# Patient Record
Sex: Male | Born: 1955 | Hispanic: No | Smoking: Current every day smoker
Health system: Southern US, Community
[De-identification: ages and names within clinical notes are randomized; demographics above are authoritative.]

## PROBLEM LIST (undated history)

## (undated) DIAGNOSIS — I2510 Atherosclerotic heart disease of native coronary artery without angina pectoris: Secondary | ICD-10-CM

## (undated) DIAGNOSIS — Q231 Congenital insufficiency of aortic valve: Secondary | ICD-10-CM

## (undated) DIAGNOSIS — E785 Hyperlipidemia, unspecified: Secondary | ICD-10-CM

## (undated) DIAGNOSIS — I219 Acute myocardial infarction, unspecified: Secondary | ICD-10-CM

## (undated) DIAGNOSIS — Q2381 Bicuspid aortic valve: Secondary | ICD-10-CM

## (undated) DIAGNOSIS — I10 Essential (primary) hypertension: Secondary | ICD-10-CM

## (undated) DIAGNOSIS — E119 Type 2 diabetes mellitus without complications: Secondary | ICD-10-CM

## (undated) HISTORY — DX: Type 2 diabetes mellitus without complications: E11.9

## (undated) HISTORY — DX: Acute myocardial infarction, unspecified: I21.9

## (undated) HISTORY — DX: Atherosclerotic heart disease of native coronary artery without angina pectoris: I25.10

## (undated) HISTORY — DX: Bicuspid aortic valve: Q23.81

## (undated) HISTORY — PX: CARDIAC CATHETERIZATION: SHX172

## (undated) HISTORY — DX: Hyperlipidemia, unspecified: E78.5

## (undated) HISTORY — DX: Essential (primary) hypertension: I10

## (undated) HISTORY — DX: Congenital insufficiency of aortic valve: Q23.1

---

## 2004-07-20 ENCOUNTER — Other Ambulatory Visit: Payer: Self-pay

## 2004-07-20 ENCOUNTER — Emergency Department: Payer: Self-pay | Admitting: Emergency Medicine

## 2004-08-06 ENCOUNTER — Ambulatory Visit: Payer: Self-pay | Admitting: Chiropractic Medicine

## 2004-08-23 ENCOUNTER — Ambulatory Visit: Payer: Self-pay

## 2005-09-08 HISTORY — PX: RENAL ARTERY STENT: SHX2321

## 2005-09-08 HISTORY — PX: CORONARY ANGIOPLASTY: SHX604

## 2005-11-06 ENCOUNTER — Observation Stay: Payer: Self-pay | Admitting: Cardiovascular Disease

## 2005-11-06 ENCOUNTER — Other Ambulatory Visit: Payer: Self-pay

## 2006-04-08 ENCOUNTER — Ambulatory Visit: Payer: Self-pay | Admitting: Gastroenterology

## 2006-05-03 ENCOUNTER — Emergency Department: Payer: Self-pay | Admitting: Emergency Medicine

## 2006-05-13 ENCOUNTER — Emergency Department: Payer: Self-pay

## 2006-09-08 DIAGNOSIS — I219 Acute myocardial infarction, unspecified: Secondary | ICD-10-CM

## 2006-09-08 HISTORY — PX: CORONARY ARTERY BYPASS GRAFT: SHX141

## 2006-09-08 HISTORY — DX: Acute myocardial infarction, unspecified: I21.9

## 2006-12-07 ENCOUNTER — Emergency Department: Payer: Self-pay | Admitting: Unknown Physician Specialty

## 2007-07-20 ENCOUNTER — Emergency Department: Payer: Self-pay | Admitting: Internal Medicine

## 2007-07-20 ENCOUNTER — Other Ambulatory Visit: Payer: Self-pay

## 2008-07-30 ENCOUNTER — Emergency Department: Payer: Self-pay | Admitting: Emergency Medicine

## 2008-12-11 ENCOUNTER — Ambulatory Visit: Payer: Self-pay | Admitting: Ophthalmology

## 2008-12-11 ENCOUNTER — Ambulatory Visit: Payer: Self-pay | Admitting: Cardiology

## 2009-01-02 ENCOUNTER — Ambulatory Visit: Payer: Self-pay | Admitting: Ophthalmology

## 2009-01-30 ENCOUNTER — Emergency Department: Payer: Self-pay | Admitting: Emergency Medicine

## 2011-03-18 ENCOUNTER — Ambulatory Visit: Payer: Self-pay | Admitting: Nephrology

## 2011-04-23 ENCOUNTER — Emergency Department: Payer: Self-pay | Admitting: *Deleted

## 2013-10-02 ENCOUNTER — Ambulatory Visit: Payer: Self-pay | Admitting: Physician Assistant

## 2013-10-24 ENCOUNTER — Ambulatory Visit: Payer: Self-pay | Admitting: Cardiovascular Disease

## 2013-10-25 ENCOUNTER — Ambulatory Visit: Payer: Self-pay | Admitting: Cardiovascular Disease

## 2013-11-02 ENCOUNTER — Ambulatory Visit (INDEPENDENT_AMBULATORY_CARE_PROVIDER_SITE_OTHER): Payer: Medicare PPO | Admitting: Cardiovascular Disease

## 2013-11-02 ENCOUNTER — Encounter: Payer: Self-pay | Admitting: Cardiovascular Disease

## 2013-11-02 VITALS — BP 142/76 | HR 50 | Ht 59.0 in | Wt 142.8 lb

## 2013-11-02 DIAGNOSIS — I25810 Atherosclerosis of coronary artery bypass graft(s) without angina pectoris: Secondary | ICD-10-CM

## 2013-11-02 DIAGNOSIS — E1165 Type 2 diabetes mellitus with hyperglycemia: Secondary | ICD-10-CM | POA: Insufficient documentation

## 2013-11-02 DIAGNOSIS — J40 Bronchitis, not specified as acute or chronic: Secondary | ICD-10-CM | POA: Insufficient documentation

## 2013-11-02 DIAGNOSIS — IMO0001 Reserved for inherently not codable concepts without codable children: Secondary | ICD-10-CM

## 2013-11-02 DIAGNOSIS — Z720 Tobacco use: Secondary | ICD-10-CM | POA: Insufficient documentation

## 2013-11-02 DIAGNOSIS — E118 Type 2 diabetes mellitus with unspecified complications: Secondary | ICD-10-CM | POA: Insufficient documentation

## 2013-11-02 DIAGNOSIS — R0602 Shortness of breath: Secondary | ICD-10-CM

## 2013-11-02 DIAGNOSIS — E785 Hyperlipidemia, unspecified: Secondary | ICD-10-CM | POA: Insufficient documentation

## 2013-11-02 DIAGNOSIS — R079 Chest pain, unspecified: Secondary | ICD-10-CM

## 2013-11-02 DIAGNOSIS — E782 Mixed hyperlipidemia: Secondary | ICD-10-CM | POA: Insufficient documentation

## 2013-11-02 DIAGNOSIS — F17200 Nicotine dependence, unspecified, uncomplicated: Secondary | ICD-10-CM | POA: Insufficient documentation

## 2013-11-02 DIAGNOSIS — IMO0002 Reserved for concepts with insufficient information to code with codable children: Secondary | ICD-10-CM

## 2013-11-02 DIAGNOSIS — I10 Essential (primary) hypertension: Secondary | ICD-10-CM | POA: Insufficient documentation

## 2013-11-02 MED ORDER — AZITHROMYCIN 250 MG PO TABS: ORAL_TABLET | ORAL | Status: DC

## 2013-11-02 MED ORDER — NICOTINE 21 MG/24HR TD PT24: 21.0000 mg | MEDICATED_PATCH | Freq: Every day | TRANSDERMAL | Status: DC

## 2013-11-02 NOTE — Assessment & Plan Note (Signed)
We have encouraged continued exercise, careful diet management in an effort to lose weight. 

## 2013-11-02 NOTE — Assessment & Plan Note (Addendum)
Currently with no symptoms of angina. No further workup at this time. Continue current medication regimen. Prior surgical records have been requested

## 2013-11-02 NOTE — Assessment & Plan Note (Signed)
Recommended he stay on his Lipitor. Goal LDL less than 70 

## 2013-11-02 NOTE — Progress Notes (Signed)
Patient ID: Jason Burke, male    DOB: 12-16-1955, 58 y.o.   MRN: [id number]  HPI Comments: Jason Burke is a 58 year old gentleman with history of CAD, CABG x3 in 2008, by his report with stent placed since that time, also renal artery stent in 2007, hypertension, diabetes, who continues to smoke 1-1/2 packs per day who presents to establish care.  He reports that overall he is doing well, is active with no symptoms of chest pain or shortness of breath. He does have some fatigue . He does have a cough with sputum for the past 2-3 months. His postnasal drip. He had an ear infection and was given amoxicillin in January 2015. His ear is better but his cough with sputum has persisted he denies any significant shortness of breath with exertion. Reports that he would like to quit smoking but did not receive his nicotine patches. Otherwise is tolerating his medications reasonably well. Reports his blood pressure is typically well controlled..  EKG shows normal sinus rhythm with rate 50 beats per minute, no significant ST or T wave changes Metoprolol started early February    BP 142/76  Pulse 50  Ht 4\' 11"  (1.499 m)  Wt 142 lb 12 oz (64.751 kg)  BMI 28.82 kg/m2  Review of Systems  Constitutional: Negative.   HENT: Negative.   Eyes: Negative.   Respiratory: Negative.   Cardiovascular: Negative.   Gastrointestinal: Negative.   Endocrine: Negative.   Musculoskeletal: Negative.   Skin: Negative.   Allergic/Immunologic: Negative.   Neurological: Negative.   Hematological: Negative.   Psychiatric/Behavioral: Negative.   All other systems reviewed and are negative.   Outpatient Encounter Prescriptions as of 11/02/2013  Medication Sig  . aspirin EC 81 MG tablet Take 81 mg by mouth daily.  Marland Kitchen. atorvastatin (LIPITOR) 80 MG tablet Take 80 mg by mouth daily.  Marland Kitchen. azithromycin (ZITHROMAX) 250 MG tablet TAKE ONE A DAY  . FLUoxetine (PROZAC) 10 MG capsule Take 10 mg by mouth daily.  Marland Kitchen. lisinopril  (PRINIVIL,ZESTRIL) 2.5 MG tablet Take 2.5 mg by mouth daily.  . metFORMIN (GLUMETZA) 500 MG (MOD) 24 hr tablet Take 500 mg by mouth 2 (two) times daily with a meal.   . metoprolol tartrate (LOPRESSOR) 25 MG tablet Take 12.5 mg by mouth 2 (two) times daily.  . nicotine (NICODERM CQ - DOSED IN MG/24 HOURS) 21 mg/24hr patch Place 1 patch (21 mg total) onto the skin daily.  . nitroGLYCERIN (NITROSTAT) 0.4 MG SL tablet Place 0.4 mg under the tongue every 5 (five) minutes as needed for chest pain.    Physical Exam  Nursing note and vitals reviewed. Constitutional: He is oriented to person, place, and time. He appears well-developed and well-nourished.  HENT:  Head: Normocephalic.  Nose: Nose normal.  Mouth/Throat: Oropharynx is clear and moist.  Eyes: Conjunctivae are normal. Pupils are equal, round, and reactive to light.  Neck: Normal range of motion. Neck supple. No JVD present.  Cardiovascular: Regular rhythm, S1 normal, S2 normal, normal heart sounds and intact distal pulses.  Bradycardia present.  Exam reveals no gallop and no friction rub.   No murmur heard. Pulmonary/Chest: Effort normal and breath sounds normal. No respiratory distress. He has no wheezes. He has no rales. He exhibits no tenderness.  Abdominal: Soft. Bowel sounds are normal. He exhibits no distension. There is no tenderness.  Musculoskeletal: Normal range of motion. He exhibits no edema and no tenderness.  Lymphadenopathy:    He has no cervical adenopathy.  Neurological: He is alert and oriented to person, place, and time. Coordination normal.  Skin: Skin is warm and dry. No rash noted. No erythema.  Psychiatric: He has a normal mood and affect. His behavior is normal. Judgment and thought content normal.      Assessment and Plan

## 2013-11-02 NOTE — Assessment & Plan Note (Signed)
Nicotine patches called him at his request. Long discussion about smoking and heart disease

## 2013-11-02 NOTE — Assessment & Plan Note (Signed)
Blood pressure is well controlled on today's visit. We have recommended he decrease his metoprolol down to 12.5 mg daily and his heart rate is very slow

## 2013-11-02 NOTE — Assessment & Plan Note (Signed)
Given his continued symptoms over the past 2 months, we have prescribed a Z-Pak. Encouraged him to followup with primary care

## 2013-11-02 NOTE — Patient Instructions (Addendum)
You are doing well. PLEASE TAKE Z PAK FOR POSSIBLE BRONCHITIS Take two the first day, then one a day for the rest of the 4 days   Nicotine patches called in   We will obtain old lab work from primary care Please cut the metoprolol down to 1/2 pill once a day  Please call us if you have new issues that need to be addressed before your next appt.  Your physician wants you to follow-up in: 6 months.  You will receive a reminder letter in the mail two months in advance. If you don't receive a letter, please call our office to schedule the follow-up appointment.

## 2014-03-08 NOTE — Telephone Encounter (Signed)
This encounter was created in error - please disregard.

## 2014-08-29 ENCOUNTER — Emergency Department: Payer: Self-pay | Admitting: Emergency Medicine

## 2014-11-28 DIAGNOSIS — S161XXD Strain of muscle, fascia and tendon at neck level, subsequent encounter: Secondary | ICD-10-CM | POA: Insufficient documentation

## 2014-12-11 DIAGNOSIS — M542 Cervicalgia: Secondary | ICD-10-CM | POA: Diagnosis not present

## 2014-12-13 DIAGNOSIS — M542 Cervicalgia: Secondary | ICD-10-CM | POA: Diagnosis not present

## 2014-12-20 DIAGNOSIS — M542 Cervicalgia: Secondary | ICD-10-CM | POA: Diagnosis not present

## 2014-12-22 DIAGNOSIS — M542 Cervicalgia: Secondary | ICD-10-CM | POA: Diagnosis not present

## 2014-12-22 DIAGNOSIS — F17200 Nicotine dependence, unspecified, uncomplicated: Secondary | ICD-10-CM | POA: Diagnosis not present

## 2014-12-22 DIAGNOSIS — I10 Essential (primary) hypertension: Secondary | ICD-10-CM | POA: Diagnosis not present

## 2014-12-22 DIAGNOSIS — E663 Overweight: Secondary | ICD-10-CM | POA: Diagnosis not present

## 2014-12-22 DIAGNOSIS — I252 Old myocardial infarction: Secondary | ICD-10-CM | POA: Diagnosis not present

## 2014-12-22 DIAGNOSIS — K219 Gastro-esophageal reflux disease without esophagitis: Secondary | ICD-10-CM | POA: Diagnosis not present

## 2014-12-22 DIAGNOSIS — J449 Chronic obstructive pulmonary disease, unspecified: Secondary | ICD-10-CM | POA: Diagnosis not present

## 2014-12-22 DIAGNOSIS — E785 Hyperlipidemia, unspecified: Secondary | ICD-10-CM | POA: Diagnosis not present

## 2014-12-22 DIAGNOSIS — E119 Type 2 diabetes mellitus without complications: Secondary | ICD-10-CM | POA: Diagnosis not present

## 2014-12-22 DIAGNOSIS — M545 Low back pain: Secondary | ICD-10-CM | POA: Diagnosis not present

## 2014-12-26 DIAGNOSIS — M542 Cervicalgia: Secondary | ICD-10-CM | POA: Diagnosis not present

## 2014-12-28 DIAGNOSIS — M6281 Muscle weakness (generalized): Secondary | ICD-10-CM | POA: Diagnosis not present

## 2014-12-28 DIAGNOSIS — M542 Cervicalgia: Secondary | ICD-10-CM | POA: Diagnosis not present

## 2015-01-09 DIAGNOSIS — S161XXD Strain of muscle, fascia and tendon at neck level, subsequent encounter: Secondary | ICD-10-CM | POA: Diagnosis not present

## 2015-01-24 DIAGNOSIS — H5710 Ocular pain, unspecified eye: Secondary | ICD-10-CM | POA: Diagnosis not present

## 2015-01-24 DIAGNOSIS — L239 Allergic contact dermatitis, unspecified cause: Secondary | ICD-10-CM | POA: Diagnosis not present

## 2015-01-24 DIAGNOSIS — R7309 Other abnormal glucose: Secondary | ICD-10-CM | POA: Diagnosis not present

## 2015-01-24 DIAGNOSIS — Z1389 Encounter for screening for other disorder: Secondary | ICD-10-CM | POA: Diagnosis not present

## 2015-03-27 DIAGNOSIS — E11319 Type 2 diabetes mellitus with unspecified diabetic retinopathy without macular edema: Secondary | ICD-10-CM | POA: Diagnosis not present

## 2015-09-25 DIAGNOSIS — Z1389 Encounter for screening for other disorder: Secondary | ICD-10-CM | POA: Diagnosis not present

## 2015-09-25 DIAGNOSIS — Z23 Encounter for immunization: Secondary | ICD-10-CM | POA: Diagnosis not present

## 2015-09-25 DIAGNOSIS — Z0000 Encounter for general adult medical examination without abnormal findings: Secondary | ICD-10-CM | POA: Diagnosis not present

## 2015-10-02 DIAGNOSIS — R7309 Other abnormal glucose: Secondary | ICD-10-CM | POA: Diagnosis not present

## 2015-10-02 DIAGNOSIS — I2510 Atherosclerotic heart disease of native coronary artery without angina pectoris: Secondary | ICD-10-CM | POA: Diagnosis not present

## 2015-10-02 DIAGNOSIS — J019 Acute sinusitis, unspecified: Secondary | ICD-10-CM | POA: Diagnosis not present

## 2015-10-02 DIAGNOSIS — F17200 Nicotine dependence, unspecified, uncomplicated: Secondary | ICD-10-CM | POA: Diagnosis not present

## 2015-10-02 DIAGNOSIS — S134XXS Sprain of ligaments of cervical spine, sequela: Secondary | ICD-10-CM | POA: Diagnosis not present

## 2015-10-02 DIAGNOSIS — I10 Essential (primary) hypertension: Secondary | ICD-10-CM | POA: Diagnosis not present

## 2015-10-02 DIAGNOSIS — E119 Type 2 diabetes mellitus without complications: Secondary | ICD-10-CM | POA: Diagnosis not present

## 2017-05-06 DIAGNOSIS — F17210 Nicotine dependence, cigarettes, uncomplicated: Secondary | ICD-10-CM | POA: Diagnosis not present

## 2017-05-06 DIAGNOSIS — I252 Old myocardial infarction: Secondary | ICD-10-CM | POA: Diagnosis not present

## 2017-05-06 DIAGNOSIS — M170 Bilateral primary osteoarthritis of knee: Secondary | ICD-10-CM | POA: Diagnosis not present

## 2017-05-06 DIAGNOSIS — Z951 Presence of aortocoronary bypass graft: Secondary | ICD-10-CM | POA: Diagnosis not present

## 2017-05-06 DIAGNOSIS — M62838 Other muscle spasm: Secondary | ICD-10-CM | POA: Diagnosis not present

## 2017-05-06 DIAGNOSIS — M62830 Muscle spasm of back: Secondary | ICD-10-CM | POA: Diagnosis not present

## 2017-05-06 DIAGNOSIS — M898X5 Other specified disorders of bone, thigh: Secondary | ICD-10-CM | POA: Diagnosis not present

## 2017-05-06 DIAGNOSIS — M25552 Pain in left hip: Secondary | ICD-10-CM | POA: Diagnosis not present

## 2017-05-06 DIAGNOSIS — E119 Type 2 diabetes mellitus without complications: Secondary | ICD-10-CM | POA: Diagnosis not present

## 2017-05-06 DIAGNOSIS — M545 Low back pain: Secondary | ICD-10-CM | POA: Diagnosis not present

## 2017-05-06 DIAGNOSIS — I10 Essential (primary) hypertension: Secondary | ICD-10-CM | POA: Diagnosis not present

## 2017-05-06 DIAGNOSIS — M25551 Pain in right hip: Secondary | ICD-10-CM | POA: Diagnosis not present

## 2017-05-06 DIAGNOSIS — M2578 Osteophyte, vertebrae: Secondary | ICD-10-CM | POA: Diagnosis not present

## 2018-03-16 ENCOUNTER — Other Ambulatory Visit: Payer: Self-pay | Admitting: Family Medicine

## 2018-03-16 DIAGNOSIS — Z13820 Encounter for screening for osteoporosis: Secondary | ICD-10-CM

## 2019-01-17 ENCOUNTER — Encounter (INDEPENDENT_AMBULATORY_CARE_PROVIDER_SITE_OTHER): Payer: Medicare HMO | Admitting: Ophthalmology

## 2019-01-17 ENCOUNTER — Other Ambulatory Visit: Payer: Self-pay

## 2019-01-17 DIAGNOSIS — E11319 Type 2 diabetes mellitus with unspecified diabetic retinopathy without macular edema: Secondary | ICD-10-CM

## 2019-01-17 DIAGNOSIS — I10 Essential (primary) hypertension: Secondary | ICD-10-CM

## 2019-01-17 DIAGNOSIS — E113392 Type 2 diabetes mellitus with moderate nonproliferative diabetic retinopathy without macular edema, left eye: Secondary | ICD-10-CM

## 2019-01-17 DIAGNOSIS — H43813 Vitreous degeneration, bilateral: Secondary | ICD-10-CM

## 2019-01-17 DIAGNOSIS — H35033 Hypertensive retinopathy, bilateral: Secondary | ICD-10-CM | POA: Diagnosis not present

## 2019-01-17 DIAGNOSIS — H34811 Central retinal vein occlusion, right eye, with macular edema: Secondary | ICD-10-CM | POA: Diagnosis not present

## 2019-02-14 ENCOUNTER — Encounter (INDEPENDENT_AMBULATORY_CARE_PROVIDER_SITE_OTHER): Payer: Medicare HMO | Admitting: Ophthalmology

## 2019-04-20 ENCOUNTER — Other Ambulatory Visit: Payer: Self-pay | Admitting: Family Medicine

## 2019-04-20 DIAGNOSIS — Z13820 Encounter for screening for osteoporosis: Secondary | ICD-10-CM

## 2019-04-22 ENCOUNTER — Telehealth: Payer: Self-pay | Admitting: *Deleted

## 2019-04-22 ENCOUNTER — Encounter: Payer: Self-pay | Admitting: *Deleted

## 2019-04-22 DIAGNOSIS — Z122 Encounter for screening for malignant neoplasm of respiratory organs: Secondary | ICD-10-CM

## 2019-04-22 DIAGNOSIS — Z87891 Personal history of nicotine dependence: Secondary | ICD-10-CM

## 2019-04-22 NOTE — Telephone Encounter (Signed)
Received referral for initial lung cancer screening scan. Contacted patient and obtained smoking history,(current, 55 pack year) as well as answering questions related to screening process. Patient denies signs of lung cancer such as weight loss or hemoptysis. Patient denies comorbidity that would prevent curative treatment if lung cancer were found. Patient is scheduled for shared decision making visit and CT scan on 04/29/19 at 145pm.

## 2019-04-29 ENCOUNTER — Other Ambulatory Visit: Payer: Self-pay

## 2019-04-29 ENCOUNTER — Inpatient Hospital Stay: Payer: Medicare HMO | Attending: Oncology | Admitting: Nurse Practitioner

## 2019-04-29 ENCOUNTER — Ambulatory Visit
Admission: RE | Admit: 2019-04-29 | Discharge: 2019-04-29 | Disposition: A | Discharge status: Home / Self Care | Payer: Medicare HMO | Source: Ambulatory Visit | Attending: Oncology | Admitting: Oncology

## 2019-04-29 DIAGNOSIS — Z122 Encounter for screening for malignant neoplasm of respiratory organs: Secondary | ICD-10-CM

## 2019-04-29 DIAGNOSIS — Z87891 Personal history of nicotine dependence: Secondary | ICD-10-CM

## 2019-04-29 NOTE — Progress Notes (Signed)
Virtual Visit via Video Enabled Telemedicine Note   I connected with Jason Burke on 04/29/19 at 1:45 PM EST by video enabled telemedicine visit and verified that I am speaking with the correct person using two identifiers.   I discussed the limitations, risks, security and privacy concerns of performing an evaluation and management service by telemedicine and the availability of in-person appointments. I also discussed with the patient that there may be a patient responsible charge related to this service. The patient expressed understanding and agreed to proceed.   Other persons participating in the visit and their role in the encounter: Burgess Estelle, RN- checking in patient & navigation  Patient's location: clinic/imaging center  Provider's location: clinic  Chief Complaint: Low Dose CT Screening  Patient agreed to evaluation by telemedicine to discuss shared decision making for consideration of low dose CT lung cancer screening.    In accordance with CMS guidelines, patient has met eligibility criteria including age, absence of signs or symptoms of lung cancer.  Social History   Tobacco Use  . Smoking status: Current Every Day Smoker    Packs/day: 1.00    Years: 55.00    Pack years: 55.00    Types: Cigarettes  . Tobacco comment: .5ppd currently  Substance Use Topics  . Alcohol use: No     A shared decision-making session was conducted prior to the performance of CT scan. This includes one or more decision aids, includes benefits and harms of screening, follow-up diagnostic testing, over-diagnosis, false positive rate, and total radiation exposure.   Counseling on the importance of adherence to annual lung cancer LDCT screening, impact of co-morbidities, and ability or willingness to undergo diagnosis and treatment is imperative for compliance of the program.   Counseling on the importance of continued smoking cessation for former smokers; the importance of smoking cessation  for current smokers, and information about tobacco cessation interventions have been given to patient including [place] and [employer] [place] programs.   Written order for lung cancer screening with LDCT has been given to the patient and any and all questions have been answered to the best of my abilities.    Yearly follow up will be coordinated by Burgess Estelle, Thoracic Navigator.  I discussed the assessment and treatment plan with the patient. The patient was provided an opportunity to ask questions and all were answered. The patient agreed with the plan and demonstrated an understanding of the instructions.   The patient was advised to call back or seek an in-person evaluation if the symptoms worsen or if the condition fails to improve as anticipated.   I provided 15 minutes of face-to-face video visit time during this encounter, and > 50% was spent counseling as documented under my assessment & plan.   Beckey Rutter, DNP, AGNP-C [place] at [facility] [phone] (work cell) [phone] (office)

## 2019-05-02 ENCOUNTER — Telehealth: Payer: Self-pay | Admitting: *Deleted

## 2019-05-02 NOTE — Telephone Encounter (Signed)
Notified patient of LDCT lung cancer screening program results with recommendation for 12 month follow up imaging. Also notified of incidental findings noted below and is encouraged to discuss further with PCP who will receive a copy of this note and/or the CT report. Patient verbalizes understanding.   IMPRESSION: 1. Lung-RADS 2, benign appearance or behavior. Continue annual screening with low-dose chest CT without contrast in 12 months. 2. Aortic Atherosclerosis (ICD10-I70.0) and Emphysema

## 2019-09-27 ENCOUNTER — Other Ambulatory Visit: Payer: Medicare HMO

## 2020-04-16 ENCOUNTER — Telehealth: Payer: Self-pay | Admitting: *Deleted

## 2020-04-16 NOTE — Telephone Encounter (Signed)
Writer phoned patient on this date and left voicemail to discuss patient's annual lung cancer screening CT scan. Request made for patient to return call to the Cancer Center.

## 2020-05-04 ENCOUNTER — Telehealth: Payer: Self-pay | Admitting: *Deleted

## 2020-05-04 NOTE — Telephone Encounter (Signed)
I called to do his annual Lung Cancer Screening. I had to leave a VM on 2 different phones. I gave him Shawn's work number to call back.

## 2020-05-19 ENCOUNTER — Telehealth: Payer: Self-pay

## 2020-05-19 NOTE — Telephone Encounter (Signed)
Message left notifying patient that it is time to schedule the low dose lung cancer screening CT scan.  Instructed patient to return call to Shawn Perkins at [phone] to verify information prior to CT scan being scheduled.    

## 2020-12-24 ENCOUNTER — Other Ambulatory Visit: Payer: Self-pay | Admitting: Physician Assistant

## 2020-12-24 DIAGNOSIS — R0989 Other specified symptoms and signs involving the circulatory and respiratory systems: Secondary | ICD-10-CM

## 2020-12-26 ENCOUNTER — Other Ambulatory Visit: Payer: Self-pay

## 2021-01-10 ENCOUNTER — Other Ambulatory Visit (INDEPENDENT_AMBULATORY_CARE_PROVIDER_SITE_OTHER): Payer: Self-pay | Admitting: Vascular Surgery

## 2021-01-10 DIAGNOSIS — R0989 Other specified symptoms and signs involving the circulatory and respiratory systems: Secondary | ICD-10-CM

## 2021-01-11 ENCOUNTER — Ambulatory Visit (INDEPENDENT_AMBULATORY_CARE_PROVIDER_SITE_OTHER): Payer: Medicare HMO

## 2021-01-11 ENCOUNTER — Encounter (INDEPENDENT_AMBULATORY_CARE_PROVIDER_SITE_OTHER): Payer: Self-pay | Admitting: Nurse Practitioner

## 2021-01-11 ENCOUNTER — Other Ambulatory Visit: Payer: Self-pay

## 2021-01-11 ENCOUNTER — Ambulatory Visit (INDEPENDENT_AMBULATORY_CARE_PROVIDER_SITE_OTHER): Payer: Medicare HMO | Admitting: Nurse Practitioner

## 2021-01-11 VITALS — BP 160/73 | HR 63 | Resp 16 | Ht <= 58 in | Wt 120.0 lb

## 2021-01-11 DIAGNOSIS — I6523 Occlusion and stenosis of bilateral carotid arteries: Secondary | ICD-10-CM | POA: Diagnosis not present

## 2021-01-11 DIAGNOSIS — I10 Essential (primary) hypertension: Secondary | ICD-10-CM | POA: Diagnosis not present

## 2021-01-11 DIAGNOSIS — R0989 Other specified symptoms and signs involving the circulatory and respiratory systems: Secondary | ICD-10-CM

## 2021-01-11 DIAGNOSIS — E785 Hyperlipidemia, unspecified: Secondary | ICD-10-CM | POA: Diagnosis not present

## 2021-01-11 DIAGNOSIS — F17200 Nicotine dependence, unspecified, uncomplicated: Secondary | ICD-10-CM

## 2021-01-11 NOTE — Progress Notes (Signed)
Subjective:    Patient ID: Jason Burke, male    DOB: 07-08-1956, 65 y.o.   MRN: [id number] Chief Complaint  Patient presents with  . New Patient (Initial Visit)    Ref Christell Constant carotid & consult longstanding bruit    The patient is seen for evaluation of carotid stenosis. The carotid stenosis was identified after bruits being identified bilaterally by Santiago Bur, PA.  The patient denies amaurosis fugax. There is no recent history of TIA symptoms or focal motor deficits. There is no prior documented CVA.  There is no history of migraine headaches. There is no history of seizures.  The patient is taking enteric-coated aspirin 81 mg daily.  The patient has a history of coronary artery disease, no recent episodes of angina or shortness of breath. The patient denies PAD or claudication symptoms. There is a history of hyperlipidemia which is being treated with a statin.    The patient has 1 to 39% carotid artery stenosis in the bilateral ICAs   Review of Systems  Eyes: Negative for visual disturbance.  All other systems reviewed and are negative.      Objective:   Physical Exam Vitals reviewed.  HENT:     Head: Normocephalic.  Neck:     Vascular: Carotid bruit present.  Cardiovascular:     Rate and Rhythm: Normal rate.     Pulses: Normal pulses.  Pulmonary:     Effort: Pulmonary effort is normal.  Skin:    General: Skin is warm and dry.  Neurological:     Mental Status: He is alert and oriented to person, place, and time.  Psychiatric:        Mood and Affect: Mood normal.        Behavior: Behavior normal.        Thought Content: Thought content normal.        Judgment: Judgment normal.     BP (!) 160/73 (BP Location: Right Arm)   Pulse 63   Resp 16   Ht 4\' 10"  (1.473 m)   Wt 120 lb (54.4 kg)   BMI 25.08 kg/m   Past Medical History:  Diagnosis Date  . Coronary artery disease   . Diabetes mellitus without complication (HCC)   . Hyperlipidemia   .  Hypertension   . MI (myocardial infarction) (HCC) 2008    Social History   Socioeconomic History  . Marital status: Married    Spouse name: Not on file  . Number of children: Not on file  . Years of education: Not on file  . Highest education level: Not on file  Occupational History  . Not on file  Tobacco Use  . Smoking status: Current Every Day Smoker    Packs/day: 1.00    Years: 55.00    Pack years: 55.00    Types: Cigarettes  . Smokeless tobacco: Never Used  . Tobacco comment: .5ppd currently  Substance and Sexual Activity  . Alcohol use: No  . Drug use: No  . Sexual activity: Not on file  Other Topics Concern  . Not on file  Social History Narrative  . Not on file   Social Determinants of Health   Financial Resource Strain: Not on file  Food Insecurity: Not on file  Transportation Needs: Not on file  Physical Activity: Not on file  Stress: Not on file  Social Connections: Not on file  Intimate Partner Violence: Not on file    Past Surgical History:  Procedure Laterality Date  .  CARDIAC CATHETERIZATION    . CORONARY ANGIOPLASTY  2007   stent x 2  . CORONARY ARTERY BYPASS GRAFT  2008   x 3  . RENAL ARTERY STENT  2007   Left kidney    Family History  Problem Relation Age of Onset  . Heart disease Mother 12       < age 28  . Heart disease Father 76    Allergies  Allergen Reactions  . Methocarbamol Palpitations    No flowsheet data found.    CMP  No results found for: NA, K, CL, CO2, GLUCOSE, BUN, CREATININE, CALCIUM, PROT, ALBUMIN, AST, ALT, ALKPHOS, BILITOT, GFRNONAA, GFRAA   No results found.     Assessment & Plan:   1. Bilateral carotid artery stenosis Recommend:  Given the patient's asymptomatic subcritical stenosis no further invasive testing or surgery at this time.  Duplex ultrasound shows 1-39% stenosis bilaterally.  Continue antiplatelet therapy as prescribed Continue management of CAD, HTN and Hyperlipidemia Healthy  heart diet,  encouraged exercise at least 4 times per week Follow up in 12 months with duplex ultrasound and physical exam   2. Smoking Smoking cessation was discussed, 3-10 minutes spent on this topic specifically   3. Essential hypertension Continue antihypertensive medications as already ordered, these medications have been reviewed and there are no changes at this time.   4. Hyperlipidemia, unspecified hyperlipidemia type Continue statin as ordered and reviewed, no changes at this time    Current Outpatient Medications on File Prior to Visit  Medication Sig Dispense Refill  . aspirin EC 81 MG tablet Take 81 mg by mouth daily.    Marland Kitchen atorvastatin (LIPITOR) 80 MG tablet Take 80 mg by mouth daily.    . D3-50 1.25 MG (50000 UT) capsule TAKE 1 CAPSULE ONE TIME WEEKLY    . FLUoxetine (PROZAC) 10 MG capsule Take 10 mg by mouth daily.    . fluticasone-salmeterol (ADVAIR) 250-50 MCG/ACT AEPB Inhale 1 puff as directed twice a day  via inhaler for ASTHMA/COPD CONTROL    . losartan (COZAAR) 25 MG tablet Take 25 mg by mouth daily.    . metFORMIN (GLUMETZA) 500 MG (MOD) 24 hr tablet Take 500 mg by mouth 2 (two) times daily with a meal.     . metoprolol tartrate (LOPRESSOR) 25 MG tablet Take 12.5 mg by mouth 2 (two) times daily.    . nitroGLYCERIN (NITROSTAT) 0.4 MG SL tablet Place 0.4 mg under the tongue every 5 (five) minutes as needed for chest pain.    . VENTOLIN HFA 108 (90 Base) MCG/ACT inhaler Inhale      2 puffs every 4 hours as needed for wheeze, cough, or SOB. (PLEASE SUBSTITUTE INSURANCE PREFERRED INHALER)    . azithromycin (ZITHROMAX) 250 MG tablet TAKE ONE A DAY (Patient not taking: Reported on 01/11/2021) 6 each 0  . lisinopril (PRINIVIL,ZESTRIL) 2.5 MG tablet Take 2.5 mg by mouth daily. (Patient not taking: Reported on 01/11/2021)    . nicotine (NICODERM CQ - DOSED IN MG/24 HOURS) 21 mg/24hr patch Place 1 patch (21 mg total) onto the skin daily. (Patient not taking: Reported on 01/11/2021)  90 patch 3   No current facility-administered medications on file prior to visit.    There are no Patient Instructions on file for this visit. No follow-ups on file.   Georgiana Spinner, NP

## 2021-01-12 ENCOUNTER — Encounter (INDEPENDENT_AMBULATORY_CARE_PROVIDER_SITE_OTHER): Payer: Self-pay | Admitting: Nurse Practitioner

## 2021-02-11 ENCOUNTER — Ambulatory Visit: Admission: RE | Admit: 2021-02-11 | Payer: Medicare HMO | Source: Ambulatory Visit

## 2021-03-04 ENCOUNTER — Ambulatory Visit: Payer: Medicare HMO | Admitting: Gastroenterology

## 2021-06-25 ENCOUNTER — Other Ambulatory Visit: Payer: Self-pay

## 2021-06-25 ENCOUNTER — Emergency Department: Payer: Medicare HMO

## 2021-06-25 ENCOUNTER — Emergency Department
Admission: EM | Admit: 2021-06-25 | Discharge: 2021-06-25 | Disposition: A | Discharge status: Home / Self Care | Payer: Medicare HMO | Attending: Emergency Medicine | Admitting: Emergency Medicine

## 2021-06-25 DIAGNOSIS — I10 Essential (primary) hypertension: Secondary | ICD-10-CM | POA: Insufficient documentation

## 2021-06-25 DIAGNOSIS — R42 Dizziness and giddiness: Secondary | ICD-10-CM | POA: Diagnosis not present

## 2021-06-25 DIAGNOSIS — F17210 Nicotine dependence, cigarettes, uncomplicated: Secondary | ICD-10-CM | POA: Diagnosis not present

## 2021-06-25 DIAGNOSIS — I2510 Atherosclerotic heart disease of native coronary artery without angina pectoris: Secondary | ICD-10-CM | POA: Diagnosis not present

## 2021-06-25 DIAGNOSIS — Z7982 Long term (current) use of aspirin: Secondary | ICD-10-CM | POA: Diagnosis not present

## 2021-06-25 DIAGNOSIS — Z951 Presence of aortocoronary bypass graft: Secondary | ICD-10-CM | POA: Diagnosis not present

## 2021-06-25 DIAGNOSIS — S161XXA Strain of muscle, fascia and tendon at neck level, initial encounter: Secondary | ICD-10-CM | POA: Diagnosis not present

## 2021-06-25 DIAGNOSIS — Y92410 Unspecified street and highway as the place of occurrence of the external cause: Secondary | ICD-10-CM | POA: Insufficient documentation

## 2021-06-25 DIAGNOSIS — E119 Type 2 diabetes mellitus without complications: Secondary | ICD-10-CM | POA: Diagnosis not present

## 2021-06-25 DIAGNOSIS — Z79899 Other long term (current) drug therapy: Secondary | ICD-10-CM | POA: Insufficient documentation

## 2021-06-25 DIAGNOSIS — Z7984 Long term (current) use of oral hypoglycemic drugs: Secondary | ICD-10-CM | POA: Insufficient documentation

## 2021-06-25 DIAGNOSIS — M25512 Pain in left shoulder: Secondary | ICD-10-CM | POA: Insufficient documentation

## 2021-06-25 DIAGNOSIS — S199XXA Unspecified injury of neck, initial encounter: Secondary | ICD-10-CM | POA: Diagnosis present
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
## 2021-06-25 NOTE — Discharge Instructions (Signed)
Your x-ray of your shoulder and CAT scan of your head and neck were all normal.  You can follow-up with your chiropractor as scheduled.  You can take Tylenol and Motrin for pain.
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
## 2021-11-28 ENCOUNTER — Emergency Department
Admission: EM | Admit: 2021-11-28 | Discharge: 2021-11-28 | Disposition: A | Discharge status: Home / Self Care | Payer: Medicare (Managed Care) | Attending: Emergency Medicine | Admitting: Emergency Medicine

## 2021-11-28 ENCOUNTER — Other Ambulatory Visit: Payer: Self-pay

## 2021-11-28 ENCOUNTER — Emergency Department: Payer: Medicare (Managed Care)

## 2021-11-28 DIAGNOSIS — R42 Dizziness and giddiness: Secondary | ICD-10-CM | POA: Diagnosis not present

## 2021-11-28 DIAGNOSIS — I2510 Atherosclerotic heart disease of native coronary artery without angina pectoris: Secondary | ICD-10-CM | POA: Diagnosis not present

## 2021-11-28 DIAGNOSIS — R55 Syncope and collapse: Secondary | ICD-10-CM | POA: Diagnosis not present

## 2021-11-28 DIAGNOSIS — E119 Type 2 diabetes mellitus without complications: Secondary | ICD-10-CM | POA: Insufficient documentation

## 2021-11-28 DIAGNOSIS — R0789 Other chest pain: Secondary | ICD-10-CM | POA: Diagnosis not present

## 2021-11-28 DIAGNOSIS — I10 Essential (primary) hypertension: Secondary | ICD-10-CM | POA: Insufficient documentation

## 2021-11-28 DIAGNOSIS — R61 Generalized hyperhidrosis: Secondary | ICD-10-CM | POA: Diagnosis not present

## 2021-11-28 DIAGNOSIS — R079 Chest pain, unspecified: Secondary | ICD-10-CM

## 2021-11-28 DIAGNOSIS — I959 Hypotension, unspecified: Secondary | ICD-10-CM | POA: Diagnosis not present

## 2021-11-28 LAB — BASIC METABOLIC PANEL
Anion gap: 8 (ref 5–15)
BUN: 20 mg/dL (ref 8–23)
CO2: 28 mmol/L (ref 22–32)
Calcium: 9.6 mg/dL (ref 8.9–10.3)
Chloride: 102 mmol/L (ref 98–111)
Creatinine, Ser: 1.07 mg/dL (ref 0.61–1.24)
GFR, Estimated: >60 mL/min (ref >60)
Glucose, Bld: 140 mg/dL — ABNORMAL HIGH (ref 70–99)
Potassium: 4.2 mmol/L (ref 3.5–5.1)
Sodium: 138 mmol/L (ref 135–145)

## 2021-11-28 LAB — CBC
HCT: 49.4 % (ref 39.0–52.0)
Hemoglobin: 16.6 g/dL (ref 13.0–17.0)
MCH: 30.9 pg (ref 26.0–34.0)
MCHC: 33.6 g/dL (ref 30.0–36.0)
MCV: 91.8 fL (ref 80.0–100.0)
Platelets: 220 10*3/uL (ref 150–400)
RBC: 5.38 MIL/uL (ref 4.22–5.81)
RDW: 13.2 % (ref 11.5–15.5)
WBC: 7.3 10*3/uL (ref 4.0–10.5)
nRBC: 0 % (ref 0.0–0.2)

## 2021-11-28 LAB — TROPONIN I (HIGH SENSITIVITY)
Troponin I (High Sensitivity): 6 ng/L (ref <18)
Troponin I (High Sensitivity): 5 ng/L (ref <18)

## 2021-11-28 MED ORDER — SODIUM CHLORIDE 0.9 % IV BOLUS
1000.0000 mL | Freq: Once | INTRAVENOUS | Status: AC
  Administered 2021-11-28: 1000 mL via INTRAVENOUS

## 2021-11-28 NOTE — ED Provider Notes (Signed)
? ?[facility] ?Provider Note ? ? ? Event Date/Time  ? First MD Initiated Contact with Patient 11/28/21 [phone]   ?  (approximate) ? ?History  ? ?Chief Complaint: Chest Pain and Loss of Consciousness ? ?HPI ? ?Jason Burke is a 66 y.o. male with a past medical history of CAD, diabetes, hypertension, hyperlipidemia, prior MI with stent presents to the emergency department for chest pain and a syncopal episode.  According to the patient he woke this morning with some right-sided chest discomfort.  Patient states it is fairly typical for him to get chest pain states he takes nitroglycerin about every other day.  However this morning he states he woke up took his nitroglycerin went down to the kitchen and sat down as he was feeling lightheaded and sweaty and had a syncopal episode.  Currently patient states he feels well denies any chest pain.  No shortness of breath. ? ?Physical Exam  ? ?Triage Vital Signs: ?ED Triage Vitals  ?Enc Vitals Group  ?   BP 11/28/21 0815 (!) 151/57  ?   Pulse Rate 11/28/21 0815 (!) 52  ?   Resp 11/28/21 0815 11  ?   Temp 11/28/21 0816 (!) 97.5 ?F (36.4 ?C)  ?   Temp Source 11/28/21 0816 Oral  ?   SpO2 11/28/21 0815 97 %  ?   Weight --   ?   Height 11/28/21 0814 4\' 10"  (1.473 m)  ?   Head Circumference --   ?   Peak Flow --   ?   Pain Score 11/28/21 0814 0  ?   Pain Loc --   ?   Pain Edu? --   ?   Excl. in GC? --   ? ? ?Most recent vital signs: ?Vitals:  ? 11/28/21 0816 11/28/21 0819  ?BP: (!) 151/57   ?Pulse: (!) 52 (!) 50  ?Resp: 14 13  ?Temp: (!) 97.5 ?F (36.4 ?C)   ?SpO2: 100% 97%  ? ? ?General: Awake, no distress.  ?CV:  Good peripheral perfusion.  Regular rate and rhythm  ?Resp:  Normal effort.  Equal breath sounds bilaterally.  ?Abd:  No distention.  Soft, nontender.  No rebound or guarding. ?Other:  No lower extremity edema. ? ? ?ED Results / Procedures / Treatments  ? ?EKG ? ?EKG viewed and interpreted by myself shows what appears to be a sinus rhythm at 54 bpm  with a narrow QRS, normal axis, normal intervals, no concerning ST changes.  Mild electrical interference noted. ? ?RADIOLOGY ? ?I personally reviewed the chest x-ray images, no acute significant abnormality seen on my evaluation. ?Chest x-ray read as negative by radiology. ? ? ?MEDICATIONS ORDERED IN ED: ?Medications - No data to display ? ? ?IMPRESSION / MDM / ASSESSMENT AND PLAN / ED COURSE  ?I reviewed the triage vital signs and the nursing notes. ? ?Patient presents to the emergency department for chest pain and a syncopal episode.  Patient awoke with chest pain which he states is fairly typical for him however took nitroglycerin x2 and then had a syncopal episode.  Patient does state he felt weak and sweaty prior to the syncopal event.  Syncopal event very likely could be because by the nitroglycerin drop in blood pressure, in combination with it being first thing in the morning/dehydration.  Reassuringly patient denies any symptoms currently.  States he feels well.  No chest pain.  We will check labs including cardiac enzymes.  We will IV hydrate  and continue to closely monitor.  EKG and chest x-ray did not appear to show any acute abnormality.  We will repeat a troponin at the 2-hour mark.  Patient is agreeable to this plan. ? ?Patient's labs are reassuring.  Chemistry is normal.  CBC is normal.  Troponin is negative x2.  Chest x-ray is clear and EKG is reassuring.  Given the patient's age and history I did consider admission to the hospital however given his reassuring work-up and has he remains chest pain-free throughout his stay in the emergency department I believe the patient is safe for discharge home with PCP follow-up.  I highly suspect the syncopal episode was due to nitroglycerin leading to transient hypotension.  Discussed return precautions.  Patient agreeable to plan. ? ?FINAL CLINICAL IMPRESSION(S) / ED DIAGNOSES  ? ?Chest pain ?Syncope ? ?Rx / DC Orders  ? ?PCP follow-up ? ?Note:  This  document was prepared using Dragon voice recognition software and may include unintentional dictation errors. ?  Minna Antis, MD ?11/28/21 1132 ? ?

## 2021-11-28 NOTE — ED Triage Notes (Signed)
Patient to ER via [facility] from home. Reports waking up with chest pressure this morning, then administering x2 NTG. Patient reports then having two syncopal episodes, witnessed by wife. Patient reports falling, then being unable to get up. Patient denies hitting head but did break his glasses. Alert and oriented x4 on arrival. ? ?On EMS arrival patient was pale and diaphoretic. Patient now denies pain or SHOB. ? ?Patient with hx of MI x2, stent placements, and CABG.  ?

## 2021-11-28 NOTE — Discharge Instructions (Signed)
Please follow-up with your doctor and cardiologist within the next several days for recheck/reevaluation.  Return to the emergency department for any return of/worsening chest pain, especially if associated with nausea, trouble breathing or sweating.  Drink plenty of fluids and obtain plenty of rest over the next 2 days. ?

## 2021-12-10 DIAGNOSIS — G3184 Mild cognitive impairment, so stated: Secondary | ICD-10-CM | POA: Diagnosis not present

## 2021-12-10 DIAGNOSIS — F17200 Nicotine dependence, unspecified, uncomplicated: Secondary | ICD-10-CM | POA: Diagnosis not present

## 2021-12-10 DIAGNOSIS — I10 Essential (primary) hypertension: Secondary | ICD-10-CM | POA: Diagnosis not present

## 2021-12-10 DIAGNOSIS — E1159 Type 2 diabetes mellitus with other circulatory complications: Secondary | ICD-10-CM | POA: Diagnosis not present

## 2021-12-12 ENCOUNTER — Other Ambulatory Visit: Payer: Self-pay | Admitting: Physician Assistant

## 2021-12-12 DIAGNOSIS — G3184 Mild cognitive impairment, so stated: Secondary | ICD-10-CM

## 2021-12-18 ENCOUNTER — Other Ambulatory Visit: Payer: Self-pay | Admitting: *Deleted

## 2021-12-18 DIAGNOSIS — Z122 Encounter for screening for malignant neoplasm of respiratory organs: Secondary | ICD-10-CM

## 2021-12-18 DIAGNOSIS — F17210 Nicotine dependence, cigarettes, uncomplicated: Secondary | ICD-10-CM

## 2021-12-18 DIAGNOSIS — Z87891 Personal history of nicotine dependence: Secondary | ICD-10-CM

## 2021-12-31 ENCOUNTER — Ambulatory Visit: Payer: Medicare (Managed Care) | Attending: Acute Care

## 2022-01-14 ENCOUNTER — Encounter (INDEPENDENT_AMBULATORY_CARE_PROVIDER_SITE_OTHER): Payer: Medicare HMO

## 2022-01-14 ENCOUNTER — Other Ambulatory Visit (INDEPENDENT_AMBULATORY_CARE_PROVIDER_SITE_OTHER): Payer: Self-pay | Admitting: Nurse Practitioner

## 2022-01-14 DIAGNOSIS — I6523 Occlusion and stenosis of bilateral carotid arteries: Secondary | ICD-10-CM

## 2022-01-22 ENCOUNTER — Encounter: Payer: Self-pay | Admitting: Internal Medicine

## 2022-01-22 NOTE — Progress Notes (Signed)
New Outpatient Visit Date: 01/23/2022  Referring Provider: Delman Cheadle, PA [address] [place],  [place] [address]  Chief Complaint: Chest pain  HPI:  [name] is a 66 y.o. male who is being seen today for the evaluation of coronary artery disease with angina at the request of Ms. Jason Burke. He has a history of coronary artery disease status post multiple PCI's (2007 and 2011) and subsequent CABG (2008; LIMA-LAD) complicated by sternal wound infection requiring removal of a wire, bicuspid aortic valve, COPD, hypertension, hyperlipidemia, type 2 diabetes mellitus, and arthritis.  He was followed in the past by Dr. Welton Burke but was lost to follow-up.  [name] reports that he has experienced exertional chest pain for more than a year.  He describes it as a pressure-like sensation radiating to the left shoulder and arm, usually when he is working in his yard.  He experienced one episode at rest, which woke him up on 11/28/2021.  He took 2 sublingual nitroglycerin and then passed out.  EMS was summoned and brought [name] to the ED.  Work-up there was largely unremarkable.  He has not had any further episodes at rest though he still gets pressure when he mows the lawn or does weed eating.  He has not taken any further sublingual nitroglycerin out of concern that it may cause him to pass out again.  He notes that his blood pressure is typically elevated, ranging 150-180/70-80.  He has mild exertional dyspnea, which is chronic.  He denies palpitations and edema.  He has occasional orthostatic lightheadedness, especially when he has been working out in the yard.  He has not been taking aspirin because he felt like it was irritating his stomach, though he denies a history of frank GI bleeding.  --------------------------------------------------------------------------------------------------  Cardiovascular History & Procedures: Cardiovascular Problems: Coronary artery disease status post  PCI's and CABG Bicuspid aortic valve  Risk Factors: known CAD, hypertension, hyperlipidemia, type 2 diabetes mellitus, male gender, age greater than 55, and tobacco use  Cath/PCI: LHC/PCI (11/13/2009, [facility]): LMCA with 20% ostial stenosis.  LAD with 40% proximal and 60% mid stenoses.  LCx with 30% proximal lesion.  Ramus intermedius with 40% diffuse disease.  RCA with diffuse disease and up to 70% stenosis in the mid vessel.  LIMA-LAD patent.  Successful PCI to mid RCA using Xience 2.75 x 23 mm drug-eluting stent (postdilated to 3.3 mm).  CV Surgery: CABG (2008): LIMA-LAD  EP Procedures and Devices: None  Non-Invasive Evaluation(s): Carotid Doppler (01/11/2021): Mild bilateral ICA stenoses (<40%).  Antegrade vertebral artery flow.  Normal subclavian artery flow hemodynamics. TTE (11/14/2009, [facility]): Normal LV size with mild LVH.  LVEF greater than 55%.  Normal RV size and function.  Bicuspid aortic valve without stenosis.  Recent CV Pertinent Labs: Lab Results  Component Value Date   K 4.2 11/28/2021   BUN 20 11/28/2021   CREATININE 1.07 11/28/2021    --------------------------------------------------------------------------------------------------  Past Medical History:  Diagnosis Date   Bicuspid aortic valve    Coronary artery disease    Diabetes mellitus without complication (HCC)    Hyperlipidemia    Hypertension    MI (myocardial infarction) (HCC) 09/08/2006    Past Surgical History:  Procedure Laterality Date   CARDIAC CATHETERIZATION     CORONARY ANGIOPLASTY  2007   stent x 2   CORONARY ARTERY BYPASS GRAFT  2008   x 3   RENAL ARTERY STENT  2007   Left kidney    Current Meds  Medication Sig   atorvastatin (LIPITOR) 80 MG tablet Take 80 mg by mouth daily.   cetirizine (ZYRTEC) 10 MG tablet Take 10 mg by mouth daily.   FLUoxetine (PROZAC) 10 MG capsule Take 10 mg by mouth daily.   fluticasone-salmeterol (ADVAIR) 250-50 MCG/ACT AEPB Inhale 1 puff into the lungs 2  (two) times daily as needed.   lisinopril (PRINIVIL,ZESTRIL) 2.5 MG tablet Take 2.5 mg by mouth daily.   losartan (COZAAR) 25 MG tablet Take 25 mg by mouth daily.   metFORMIN (GLUMETZA) 500 MG (MOD) 24 hr tablet Take 500 mg by mouth daily with breakfast.   metoprolol tartrate (LOPRESSOR) 25 MG tablet Take 12.5 mg by mouth daily.   nitroGLYCERIN (NITROSTAT) 0.4 MG SL tablet Place 0.4 mg under the tongue every 5 (five) minutes as needed for chest pain.   VENTOLIN HFA 108 (90 Base) MCG/ACT inhaler Inhale      2 puffs every 4 hours as needed for wheeze, cough, or SOB. (PLEASE SUBSTITUTE INSURANCE PREFERRED INHALER)   Vitamin D, Ergocalciferol, (DRISDOL) 1.25 MG (50000 UNIT) CAPS capsule Take 50,000 Units by mouth every 7 (seven) days. Monday    Allergies: Methocarbamol  Social History   Tobacco Use   Smoking status: Every Day    Packs/day: 1.00    Years: 55.00    Pack years: 55.00    Types: Cigarettes   Smokeless tobacco: Never   Tobacco comments:    01/23/2022 2 cigarettes per day currently  Vaping Use   Vaping Use: Never used  Substance Use Topics   Alcohol use: No   Drug use: No    Family History  Problem Relation Age of Onset   Heart disease Mother 20       < age 68   Heart disease Father 89   Alcoholism Father     Review of Systems: A 12-system review of systems was performed and was negative except as noted in the HPI.  --------------------------------------------------------------------------------------------------  Physical Exam: BP (!) 150/80 (BP Location: Right Arm, Patient Position: Sitting, Cuff Size: Normal)   Pulse 69   Ht 4\' 9"  (1.448 m)   Wt 117 lb (53.1 kg)   SpO2 97%   BMI 25.32 kg/m   General: NAD.  Accompanied by his wife. HEENT: No conjunctival pallor or scleral icterus. Facemask in place. Neck: Supple without lymphadenopathy, thyromegaly, JVD, or HJR. No carotid bruit. Lungs: Normal work of breathing.  Mildly diminished breath sounds throughout  without wheezes or crackles. Heart: Regular rate and rhythm with 1/6 systolic murmur loudest at the left lower sternal border.  No rubs or gallops.  Nondisplaced PMI. Abd: Bowel sounds present. Soft, NT/ND without hepatosplenomegaly Ext: No lower extremity edema. Radial, PT, and DP pulses are 2+ bilaterally Skin: Warm and dry without rash. Neuro: CNIII-XII intact. Strength and fine-touch sensation intact in upper and lower extremities bilaterally. Psych: Normal mood and affect.  EKG: Normal sinus rhythm with nonspecific ST/T changes.  Lab Results  Component Value Date   WBC 7.3 11/28/2021   HGB 16.6 11/28/2021   HCT 49.4 11/28/2021   MCV 91.8 11/28/2021   PLT 220 11/28/2021    Lab Results  Component Value Date   NA 138 11/28/2021   K 4.2 11/28/2021   CL 102 11/28/2021   CO2 28 11/28/2021   BUN 20 11/28/2021   CREATININE 1.07 11/28/2021   GLUCOSE 140 (H) 11/28/2021    No results found for: CHOL, HDL, LDLCALC, LDLDIRECT, TRIG, CHOLHDL   --------------------------------------------------------------------------------------------------  ASSESSMENT  AND PLAN: Coronary artery disease with accelerating angina: [name] reports more than a years history of chest pressure rating to the left shoulder and arm with yard work.  It seems to have gotten a little bit worse over the last few months, including one episode that woke him and led to ED visit after he passed out following 2 doses of nitroglycerin.  Fortunately, he has not had any rest pain since then.  I worry that his symptoms may reflect progressive coronary disease.  However, given his history of bicuspid aortic valve, his symptoms could also be a reflection of worsening aortic stenosis.  We will begin with an echocardiogram.  If it is unremarkable, we have agreed to obtain a pharmacologic MPI to assess his ischemic burden.  If echo shows significant abnormalities, would instead favor proceeding with cardiac catheterization.  In the  meantime, I have asked [name] to restart aspirin 81 mg daily.  We will also add isosorbide mononitrate 15 mg daily.  I have advised him to alert us if his symptoms do not improve.  If he has significant worsening, he should seek immediate medical attention.  Bicuspid aortic valve: [facility]  Exam today notable for a very subtle systolic murmur on examination today.  I am doubtful that he has severe AS, but given his aforementioned symptoms, I think it would be prudent to obtain an expedited echocardiogram.  Hypertension: Blood pressure suboptimally controlled today and at home.  We have agreed to add isosorbide mononitrate for antianginal therapy and improved blood pressure control.  We will continue current doses of metoprolol and losartan.  Hyperlipidemia: No recent lipid panel available in our records.  Continue atorvastatin 80 mg daily.  Updated lipid panel should be checked with his next blood draw if it has not been done within the last 6 months through his PCP.  Tobacco abuse: [name] continues to smoke.  I have encouraged him to quit smoking.  Shared Decision Making/Informed Consent The risks [chest pain, shortness of breath, cardiac arrhythmias, dizziness, blood pressure fluctuations, myocardial infarction, stroke/transient ischemic attack, nausea, vomiting, allergic reaction, radiation exposure, metallic taste sensation and life-threatening complications (estimated to be 1 in 10,000)], benefits (risk stratification, diagnosing coronary artery disease, treatment guidance) and alternatives of a nuclear stress test were discussed in detail with [name] and he agrees to proceed.  Follow-up: Return to clinic in 1 month.  Yvonne Kendallhristopher Nadene Witherspoon, MD 01/23/2022 8:45 AM

## 2022-01-23 ENCOUNTER — Ambulatory Visit (INDEPENDENT_AMBULATORY_CARE_PROVIDER_SITE_OTHER): Payer: Medicare (Managed Care) | Admitting: Internal Medicine

## 2022-01-23 ENCOUNTER — Encounter: Payer: Self-pay | Admitting: Internal Medicine

## 2022-01-23 VITALS — BP 150/80 | HR 69 | Ht <= 58 in | Wt 117.0 lb

## 2022-01-23 DIAGNOSIS — E785 Hyperlipidemia, unspecified: Secondary | ICD-10-CM

## 2022-01-23 DIAGNOSIS — R079 Chest pain, unspecified: Secondary | ICD-10-CM

## 2022-01-23 DIAGNOSIS — Z720 Tobacco use: Secondary | ICD-10-CM

## 2022-01-23 DIAGNOSIS — R0602 Shortness of breath: Secondary | ICD-10-CM

## 2022-01-23 DIAGNOSIS — I10 Essential (primary) hypertension: Secondary | ICD-10-CM | POA: Diagnosis not present

## 2022-01-23 DIAGNOSIS — I200 Unstable angina: Secondary | ICD-10-CM | POA: Diagnosis not present

## 2022-01-23 DIAGNOSIS — Q231 Congenital insufficiency of aortic valve: Secondary | ICD-10-CM | POA: Diagnosis not present

## 2022-01-23 MED ORDER — ISOSORBIDE MONONITRATE ER 30 MG PO TB24: 15.0000 mg | ORAL_TABLET | Freq: Every day | ORAL | 1 refills | Status: DC

## 2022-01-23 MED ORDER — ASPIRIN 81 MG PO TBEC: 81.0000 mg | DELAYED_RELEASE_TABLET | Freq: Every day | ORAL | Status: AC

## 2022-01-23 NOTE — Patient Instructions (Addendum)
Medication Instructions:   Your physician has recommended you make the following change in your medication:   STOP Lisinopril  START Aspirin 81 mg daily (over-the-counter) samples given today   START Isosorbide Mononitrate (Imdur) 15 mg daily   *If you need a refill on your cardiac medications before your next appointment, please call your pharmacy*   Lab Work:  None ordered  Testing/Procedures:  Your physician has requested that you have an echocardiogram (in 1-2 weeks). Echocardiography is a painless test that uses sound waves to create images of your heart. It provides your doctor with information about the size and shape of your heart and how well your heart's chambers and valves are working. This procedure takes approximately one hour. There are no restrictions for this procedure.   Follow-Up: At [facility], you and your health needs are our priority.  As part of our continuing mission to provide you with exceptional heart care, we have created designated Provider Care Teams.  These Care Teams include your primary Cardiologist (physician) and Advanced Practice Providers (APPs -  Physician Assistants and Nurse Practitioners) who all work together to provide you with the care you need, when you need it.  We recommend signing up for the patient portal called "MyChart".  Sign up information is provided on this After Visit Summary.  MyChart is used to connect with patients for Virtual Visits (Telemedicine).  Patients are able to view lab/test results, encounter notes, upcoming appointments, etc.  Non-urgent messages can be sent to your provider as well.   To learn more about what you can do with MyChart, go to [email or web address].    Your next appointment:   1 month(s)  The format for your next appointment:   In Person  Provider:   You may see Dr. Harrell Gave End or one of the following Advanced Practice Providers on your designated Care Team:   Murray Hodgkins, NP Christell Faith, PA-C Cadence Kathlen Mody, PA-C{   Important Information About Sugar

## 2022-01-24 ENCOUNTER — Encounter: Payer: Self-pay | Admitting: Internal Medicine

## 2022-01-24 DIAGNOSIS — I200 Unstable angina: Secondary | ICD-10-CM | POA: Insufficient documentation

## 2022-01-24 DIAGNOSIS — Q231 Congenital insufficiency of aortic valve: Secondary | ICD-10-CM | POA: Insufficient documentation

## 2022-01-24 DIAGNOSIS — Q2381 Bicuspid aortic valve: Secondary | ICD-10-CM | POA: Insufficient documentation

## 2022-01-30 ENCOUNTER — Ambulatory Visit: Admission: RE | Admit: 2022-01-30 | Payer: Medicare (Managed Care) | Source: Ambulatory Visit

## 2022-02-28 ENCOUNTER — Ambulatory Visit: Payer: Medicare (Managed Care) | Admitting: Nurse Practitioner
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
## 2022-04-03 ENCOUNTER — Institutional Professional Consult (permissible substitution): Payer: Medicare (Managed Care) | Admitting: Internal Medicine

## 2022-07-09 DIAGNOSIS — Z0130 Encounter for examination of blood pressure without abnormal findings: Secondary | ICD-10-CM | POA: Diagnosis not present

## 2022-07-09 DIAGNOSIS — F17210 Nicotine dependence, cigarettes, uncomplicated: Secondary | ICD-10-CM | POA: Diagnosis not present

## 2022-07-09 DIAGNOSIS — Z1389 Encounter for screening for other disorder: Secondary | ICD-10-CM | POA: Diagnosis not present

## 2022-07-09 DIAGNOSIS — E1159 Type 2 diabetes mellitus with other circulatory complications: Secondary | ICD-10-CM | POA: Diagnosis not present

## 2022-07-09 DIAGNOSIS — Z712 Person consulting for explanation of examination or test findings: Secondary | ICD-10-CM | POA: Diagnosis not present

## 2022-07-09 DIAGNOSIS — I25118 Atherosclerotic heart disease of native coronary artery with other forms of angina pectoris: Secondary | ICD-10-CM | POA: Diagnosis not present

## 2022-07-09 DIAGNOSIS — F411 Generalized anxiety disorder: Secondary | ICD-10-CM | POA: Diagnosis not present

## 2022-07-14 ENCOUNTER — Other Ambulatory Visit: Payer: Self-pay

## 2022-07-14 ENCOUNTER — Emergency Department: Payer: Medicare (Managed Care)

## 2022-07-14 ENCOUNTER — Emergency Department
Admission: EM | Admit: 2022-07-14 | Discharge: 2022-07-14 | Disposition: A | Discharge status: Home / Self Care | Payer: Medicare (Managed Care) | Attending: Emergency Medicine | Admitting: Emergency Medicine

## 2022-07-14 DIAGNOSIS — E119 Type 2 diabetes mellitus without complications: Secondary | ICD-10-CM | POA: Diagnosis not present

## 2022-07-14 DIAGNOSIS — R0602 Shortness of breath: Secondary | ICD-10-CM | POA: Insufficient documentation

## 2022-07-14 DIAGNOSIS — I2510 Atherosclerotic heart disease of native coronary artery without angina pectoris: Secondary | ICD-10-CM | POA: Insufficient documentation

## 2022-07-14 DIAGNOSIS — I10 Essential (primary) hypertension: Secondary | ICD-10-CM | POA: Diagnosis not present

## 2022-07-14 DIAGNOSIS — R079 Chest pain, unspecified: Secondary | ICD-10-CM | POA: Diagnosis not present

## 2022-07-14 DIAGNOSIS — R0789 Other chest pain: Secondary | ICD-10-CM | POA: Diagnosis not present

## 2022-07-14 LAB — CBC
HCT: 44.5 % (ref 39.0–52.0)
Hemoglobin: 15.3 g/dL (ref 13.0–17.0)
MCH: 30.5 pg (ref 26.0–34.0)
MCHC: 34.4 g/dL (ref 30.0–36.0)
MCV: 88.6 fL (ref 80.0–100.0)
Platelets: 205 10*3/uL (ref 150–400)
RBC: 5.02 MIL/uL (ref 4.22–5.81)
RDW: 12.7 % (ref 11.5–15.5)
WBC: 7.6 10*3/uL (ref 4.0–10.5)
nRBC: 0 % (ref 0.0–0.2)

## 2022-07-14 LAB — BASIC METABOLIC PANEL
Anion gap: 6 (ref 5–15)
BUN: 17 mg/dL (ref 8–23)
CO2: 29 mmol/L (ref 22–32)
Calcium: 9.3 mg/dL (ref 8.9–10.3)
Chloride: 103 mmol/L (ref 98–111)
Creatinine, Ser: 0.96 mg/dL (ref 0.61–1.24)
GFR, Estimated: >60 mL/min (ref >60)
Glucose, Bld: 223 mg/dL — ABNORMAL HIGH (ref 70–99)
Potassium: 4.1 mmol/L (ref 3.5–5.1)
Sodium: 138 mmol/L (ref 135–145)

## 2022-07-14 LAB — TROPONIN I (HIGH SENSITIVITY)
Troponin I (High Sensitivity): 8 ng/L (ref <18)
Troponin I (High Sensitivity): 11 ng/L (ref <18)

## 2022-07-14 LAB — BRAIN NATRIURETIC PEPTIDE: B Natriuretic Peptide: 115.3 pg/mL — ABNORMAL HIGH (ref 0.0–100.0)

## 2022-07-14 MED ORDER — IOHEXOL 350 MG/ML SOLN
75.0000 mL | Freq: Once | INTRAVENOUS | Status: AC | PRN
  Administered 2022-07-14: 75 mL via INTRAVENOUS

## 2022-07-14 NOTE — Discharge Instructions (Addendum)
-  The cardiology office should be contacting you within the next few days.  If they do not, please use the contact information listed on this paper to schedule appointment.  -Return to the emergency department anytime if you begin to experience any new or worsening symptoms.

## 2022-07-14 NOTE — ED Triage Notes (Signed)
Pt to ED via POV from home. Pt reports ongoing CP x6 weeks. Pt states pain was worse today. Pt also endorse HA and SOB. Pt reports has been working in his yard a lot more that has exacerbated the pain.   Pt with hx MI and CABG. Pt reports taking nitro pill PTA with no relief.

## 2022-07-14 NOTE — ED Notes (Signed)
Patient transported to CT 

## 2022-07-14 NOTE — ED Provider Notes (Cosign Needed Addendum)
[facility] Provider Note    Event Date/Time   First MD Initiated Contact with Patient 07/14/22 1617     (approximate)   History   Chief Complaint Chest Pain   HPI Jason Burke is a 66 y.o. male, history of hypertension, hyperlipidemia, diabetes, CAD, presents to the emergency department for evaluation of chest pain.  Patient states that the chest pain is been going on for over a year, but has been slightly worse over the past 6 weeks.  He states that his chest pain only comes whenever he is doing exertional activities, such as raking leaves or any yard work.  He states that he was supposed to have a cardiology appointment this week, though they have reportedly not scheduled him for anything.  He is currently asymptomatic at this time.  Denies fever/chills, shortness of breath, abdominal pain, leg swelling, weakness, flank pain, nausea/vomiting, diarrhea, dysuria, dizziness/lightheadedness, or numb/ting in upper or lower extremities.  History Limitations: No limitations.        Physical Exam  Triage Vital Signs: ED Triage Vitals  Enc Vitals Group     BP 07/14/22 1450 (!) 144/111     Pulse Rate 07/14/22 1450 66     Resp 07/14/22 1450 17     Temp 07/14/22 1450 98 F (36.7 C)     Temp Source 07/14/22 1450 Oral     SpO2 07/14/22 1450 99 %     Weight --      Height --      Head Circumference --      Peak Flow --      Pain Score 07/14/22 1447 10     Pain Loc --      Pain Edu? --      Excl. in GC? --     Most recent vital signs: Vitals:   07/14/22 1450  BP: (!) 144/111  Pulse: 66  Resp: 17  Temp: 98 F (36.7 C)  SpO2: 99%    General: Awake, NAD.  Skin: Warm, dry. No rashes or lesions.  Eyes: PERRL. Conjunctivae normal.  CV: Good peripheral perfusion.  S1 and S2 present.  No murmurs, rubs, or gallops. Resp: Normal effort.  Lung sounds are clear bilaterally. Abd: Soft, non-tender. No distention.  Neuro: At baseline. No gross neurological  deficits.  Musculoskeletal: Normal ROM of all extremities.  Focused Exam: No leg or calf swelling.  Physical Exam    ED Results / Procedures / Treatments  Labs (all labs ordered are listed, but only abnormal results are displayed) Labs Reviewed  BASIC METABOLIC PANEL - Abnormal; Notable for the following components:      Result Value   Glucose, Bld 223 (*)    All other components within normal limits  BRAIN NATRIURETIC PEPTIDE - Abnormal; Notable for the following components:   B Natriuretic Peptide 115.3 (*)    All other components within normal limits  CBC  TROPONIN I (HIGH SENSITIVITY)  TROPONIN I (HIGH SENSITIVITY)     EKG Sinus rhythm, rate of 72, 1 mm ST segment depression in inferior leads, consistent with previous EKGs.  No significant ST segment elevation.  Normal QRS.  No QT prolongation.  No significant axis deviations.    RADIOLOGY  ED Provider Interpretation: I personally reviewed and interpreted this x-ray, no evidence of acute cardiopulmonary abnormalities.  No evidence of pulmonary embolism on CT angio.  CT Angio Chest PE W/Cm &/Or Wo Cm  Result Date: 07/14/2022 CLINICAL DATA:  Chest  pain x6 weeks, headache, shortness of breath EXAM: CT ANGIOGRAPHY CHEST WITH CONTRAST TECHNIQUE: Multidetector CT imaging of the chest was performed using the standard protocol during bolus administration of intravenous contrast. Multiplanar CT image reconstructions and MIPs were obtained to evaluate the vascular anatomy. RADIATION DOSE REDUCTION: This exam was performed according to the departmental dose-optimization program which includes automated exposure control, adjustment of the mA and/or kV according to patient size and/or use of iterative reconstruction technique. CONTRAST:  32mL OMNIPAQUE IOHEXOL 350 MG/ML SOLN COMPARISON:  CT chest dated 04/29/2019 FINDINGS: Cardiovascular: Satisfactory opacification of the bilateral pulmonary arteries to the segmental level. No evidence of  pulmonary embolism. Although not tailored for evaluation of the thoracic aorta, there is no evidence thoracic aortic aneurysm or dissection. Atherosclerotic calcifications of the aortic arch. The heart is normal in size.  No pericardial effusion. Coronary atherosclerosis of the LAD. Mediastinum/Nodes: No suspicious mediastinal lymphadenopathy. Visualized thyroid is unremarkable. Lungs/Pleura: Moderate centrilobular and paraseptal emphysematous changes, upper lung predominant. No focal consolidation. No suspicious pulmonary nodules. No pleural effusion or pneumothorax. Upper Abdomen: Visualized upper abdomen is grossly unremarkable. Musculoskeletal: Median sternotomy. Thoracolumbar spine is within normal limits. Review of the MIP images confirms the above findings. IMPRESSION: No evidence of pulmonary embolism. No evidence of acute cardiopulmonary disease. Aortic Atherosclerosis (ICD10-I70.0) and Emphysema (ICD10-J43.9). Electronically Signed   By: Julian Hy M.D.   On: 07/14/2022 17:33   DG Chest 2 View  Result Date: 07/14/2022 CLINICAL DATA:  Chest pain. EXAM: CHEST - 2 VIEW COMPARISON:  November 28, 2021. FINDINGS: The heart size and mediastinal contours are within normal limits. Both lungs are clear. Sternotomy wires are noted. The visualized skeletal structures are unremarkable. IMPRESSION: No active cardiopulmonary disease. Electronically Signed   By: Marijo Conception M.D.   On: 07/14/2022 15:19    PROCEDURES:  Critical Care performed: N/A.  Procedures    MEDICATIONS ORDERED IN ED: Medications  iohexol (OMNIPAQUE) 350 MG/ML injection 75 mL (75 mLs Intravenous Contrast Given 07/14/22 1719)     IMPRESSION / MDM / ASSESSMENT AND PLAN / ED COURSE  I reviewed the triage vital signs and the nursing notes.                              Differential diagnosis includes, but is not limited to, ACS, PE, heart failure, pneumonia, hypertension, anxiety/depression, aortic dissection,  myocarditis/pericarditis.  ED Course Patient appears well, vitals within normal limits.  NAD.  CBC shows no leukocytosis, thrombocytopenia, or anemia.  BMP shows newly elevated glucose at 223, otherwise normal vitals.   Initial troponin 8.  Second troponin 11.  EKG unremarkable.   Assessment/Plan Patient presents with chest pain has been ongoing for the past year, slightly worsened over the past 6 weeks.  His symptoms are exertional in nature.  EKG is unremarkable.  His troponin values are reassuring.  BNP is slightly elevated at 115, otherwise no significant abnormalities.  His CT angio does not show any evidence of pulmonary embolism.  I suspect that his symptoms are like related to stable angina.  He states that he does have a cardiologist, however does not have an appointment established in the near future.  He expressed interest in establishing with a new provider.  We will provide him with a referral for 1 of ours.  Recommend they follow-up within the next 1 to 2 weeks.  He is otherwise stable for discharge.  Considered admission for this patient,  but given his stable presentation and unremarkable work-up, he is unlikely benefit from admission.  Provided the patient with anticipatory guidance, return precautions, and educational material. Encouraged the patient to return to the emergency department at any time if they begin to experience any new or worsening symptoms. Patient expressed understanding and agreed with the plan.   Patient's presentation is most consistent with acute complicated illness / injury requiring diagnostic workup.       FINAL CLINICAL IMPRESSION(S) / ED DIAGNOSES   Final diagnoses:  Chest pain, unspecified type     Rx / DC Orders   ED Discharge Orders          Ordered    Ambulatory referral to Cardiology       Comments: If you have not heard from the Cardiology office within the next 72 hours please call [phone].   07/14/22 1750              Note:  This document was prepared using Dragon voice recognition software and may include unintentional dictation errors.   Varney Daily, PA 07/14/22 1751    Varney Daily, PA 07/14/22 1751    Concha Se, MD 07/15/22 1517

## 2022-07-14 NOTE — ED Notes (Signed)
Patient transported to X-ray 

## 2022-07-16 DIAGNOSIS — Z0130 Encounter for examination of blood pressure without abnormal findings: Secondary | ICD-10-CM | POA: Diagnosis not present

## 2022-07-16 DIAGNOSIS — I10 Essential (primary) hypertension: Secondary | ICD-10-CM | POA: Diagnosis not present

## 2022-07-16 DIAGNOSIS — F411 Generalized anxiety disorder: Secondary | ICD-10-CM | POA: Diagnosis not present

## 2022-07-16 DIAGNOSIS — Z712 Person consulting for explanation of examination or test findings: Secondary | ICD-10-CM | POA: Diagnosis not present

## 2022-07-16 DIAGNOSIS — Z1389 Encounter for screening for other disorder: Secondary | ICD-10-CM | POA: Diagnosis not present

## 2022-07-16 DIAGNOSIS — R413 Other amnesia: Secondary | ICD-10-CM | POA: Diagnosis not present

## 2022-07-16 DIAGNOSIS — G3184 Mild cognitive impairment, so stated: Secondary | ICD-10-CM | POA: Diagnosis not present

## 2022-07-24 ENCOUNTER — Encounter: Payer: Self-pay | Admitting: Internal Medicine

## 2022-07-24 ENCOUNTER — Other Ambulatory Visit: Payer: Self-pay

## 2022-07-24 ENCOUNTER — Observation Stay
Admission: RE | Admit: 2022-07-24 | Discharge: 2022-07-25 | Disposition: A | Discharge status: Home / Self Care | Payer: Medicare (Managed Care) | Source: Ambulatory Visit | Attending: Internal Medicine | Admitting: Internal Medicine

## 2022-07-24 ENCOUNTER — Ambulatory Visit: Payer: Medicare (Managed Care) | Attending: Internal Medicine

## 2022-07-24 ENCOUNTER — Observation Stay: Payer: Medicare (Managed Care)

## 2022-07-24 ENCOUNTER — Encounter
Admission: RE | Disposition: A | Discharge status: Home / Self Care | Payer: Self-pay | Source: Ambulatory Visit | Attending: Internal Medicine

## 2022-07-24 ENCOUNTER — Ambulatory Visit: Payer: Medicare (Managed Care) | Attending: Internal Medicine | Admitting: Internal Medicine

## 2022-07-24 ENCOUNTER — Encounter: Payer: Self-pay | Admitting: Cardiology

## 2022-07-24 VITALS — BP 124/72 | HR 66 | Ht <= 58 in | Wt 122.0 lb

## 2022-07-24 DIAGNOSIS — I200 Unstable angina: Secondary | ICD-10-CM | POA: Diagnosis not present

## 2022-07-24 DIAGNOSIS — R197 Diarrhea, unspecified: Secondary | ICD-10-CM | POA: Diagnosis present

## 2022-07-24 DIAGNOSIS — I25110 Atherosclerotic heart disease of native coronary artery with unstable angina pectoris: Principal | ICD-10-CM | POA: Insufficient documentation

## 2022-07-24 DIAGNOSIS — I25700 Atherosclerosis of coronary artery bypass graft(s), unspecified, with unstable angina pectoris: Secondary | ICD-10-CM

## 2022-07-24 DIAGNOSIS — E785 Hyperlipidemia, unspecified: Secondary | ICD-10-CM | POA: Diagnosis not present

## 2022-07-24 DIAGNOSIS — I25118 Atherosclerotic heart disease of native coronary artery with other forms of angina pectoris: Secondary | ICD-10-CM | POA: Diagnosis not present

## 2022-07-24 DIAGNOSIS — E782 Mixed hyperlipidemia: Secondary | ICD-10-CM | POA: Diagnosis present

## 2022-07-24 DIAGNOSIS — I2510 Atherosclerotic heart disease of native coronary artery without angina pectoris: Secondary | ICD-10-CM | POA: Diagnosis present

## 2022-07-24 DIAGNOSIS — R0789 Other chest pain: Secondary | ICD-10-CM | POA: Diagnosis present

## 2022-07-24 DIAGNOSIS — Q231 Congenital insufficiency of aortic valve: Secondary | ICD-10-CM | POA: Diagnosis not present

## 2022-07-24 DIAGNOSIS — Z720 Tobacco use: Secondary | ICD-10-CM | POA: Diagnosis not present

## 2022-07-24 DIAGNOSIS — E119 Type 2 diabetes mellitus without complications: Secondary | ICD-10-CM | POA: Insufficient documentation

## 2022-07-24 DIAGNOSIS — E118 Type 2 diabetes mellitus with unspecified complications: Secondary | ICD-10-CM | POA: Diagnosis not present

## 2022-07-24 DIAGNOSIS — Z7984 Long term (current) use of oral hypoglycemic drugs: Secondary | ICD-10-CM | POA: Insufficient documentation

## 2022-07-24 DIAGNOSIS — F17210 Nicotine dependence, cigarettes, uncomplicated: Secondary | ICD-10-CM | POA: Insufficient documentation

## 2022-07-24 DIAGNOSIS — I10 Essential (primary) hypertension: Secondary | ICD-10-CM | POA: Diagnosis present

## 2022-07-24 DIAGNOSIS — Z951 Presence of aortocoronary bypass graft: Secondary | ICD-10-CM | POA: Diagnosis not present

## 2022-07-24 DIAGNOSIS — Z955 Presence of coronary angioplasty implant and graft: Secondary | ICD-10-CM | POA: Diagnosis not present

## 2022-07-24 DIAGNOSIS — Z7982 Long term (current) use of aspirin: Secondary | ICD-10-CM | POA: Insufficient documentation

## 2022-07-24 DIAGNOSIS — Z79899 Other long term (current) drug therapy: Secondary | ICD-10-CM | POA: Insufficient documentation

## 2022-07-24 HISTORY — PX: LEFT HEART CATH AND CORS/GRAFTS ANGIOGRAPHY: CATH118250

## 2022-07-24 LAB — CBC
HCT: 40.9 % (ref 39.0–52.0)
Hemoglobin: 14.2 g/dL (ref 13.0–17.0)
MCH: 30.9 pg (ref 26.0–34.0)
MCHC: 34.7 g/dL (ref 30.0–36.0)
MCV: 88.9 fL (ref 80.0–100.0)
Platelets: 190 10*3/uL (ref 150–400)
RBC: 4.6 MIL/uL (ref 4.22–5.81)
RDW: 12.9 % (ref 11.5–15.5)
WBC: 5.5 10*3/uL (ref 4.0–10.5)
nRBC: 0 % (ref 0.0–0.2)

## 2022-07-24 LAB — ECHOCARDIOGRAM COMPLETE
AR max vel: 1.08 cm2
AV Area VTI: 0.98 cm2
AV Area mean vel: 1 cm2
AV Mean grad: 6 mmHg
AV Peak grad: 11.3 mmHg
Ao pk vel: 1.68 m/s
Area-P 1/2: 2.84 cm2
Calc EF: 60.3 %
Height: 57 in
S' Lateral: 2.1 cm
Single Plane A2C EF: 48.6 %
Single Plane A4C EF: 69 %
Weight: 1952 oz

## 2022-07-24 LAB — GLUCOSE, CAPILLARY
Glucose-Capillary: 104 mg/dL — ABNORMAL HIGH (ref 70–99)
Glucose-Capillary: 80 mg/dL (ref 70–99)
Glucose-Capillary: 147 mg/dL — ABNORMAL HIGH (ref 70–99)
Glucose-Capillary: 90 mg/dL (ref 70–99)
Glucose-Capillary: 96 mg/dL (ref 70–99)

## 2022-07-24 LAB — BASIC METABOLIC PANEL
Anion gap: 5 (ref 5–15)
BUN: 15 mg/dL (ref 8–23)
CO2: 24 mmol/L (ref 22–32)
Calcium: 8 mg/dL — ABNORMAL LOW (ref 8.9–10.3)
Chloride: 110 mmol/L (ref 98–111)
Creatinine, Ser: 0.82 mg/dL (ref 0.61–1.24)
GFR, Estimated: >60 mL/min (ref >60)
Glucose, Bld: 162 mg/dL — ABNORMAL HIGH (ref 70–99)
Potassium: 3.5 mmol/L (ref 3.5–5.1)
Sodium: 139 mmol/L (ref 135–145)

## 2022-07-24 LAB — PROTIME-INR
INR: 1.1 (ref 0.8–1.2)
Prothrombin Time: 14.4 seconds (ref 11.4–15.2)

## 2022-07-24 LAB — HEMOGLOBIN A1C
Hgb A1c MFr Bld: 6.2 % — ABNORMAL HIGH (ref 4.8–5.6)
Mean Plasma Glucose: 131.24 mg/dL

## 2022-07-24 LAB — TROPONIN I (HIGH SENSITIVITY)
Troponin I (High Sensitivity): 12 ng/L (ref <18)
Troponin I (High Sensitivity): 11 ng/L (ref <18)

## 2022-07-24 SURGERY — LEFT HEART CATH AND CORS/GRAFTS ANGIOGRAPHY
Anesthesia: Moderate Sedation | Laterality: Left

## 2022-07-24 MED ORDER — LOSARTAN POTASSIUM 25 MG PO TABS
25.0000 mg | ORAL_TABLET | Freq: Every day | ORAL | Status: DC
  Administered 2022-07-24 – 2022-07-25 (×2): 25 mg via ORAL
  Filled 2022-07-24 (×2): qty 1

## 2022-07-24 MED ORDER — ALBUTEROL SULFATE HFA 108 (90 BASE) MCG/ACT IN AERS: 1.0000 | INHALATION_SPRAY | Freq: Four times a day (QID) | RESPIRATORY_TRACT | Status: DC | PRN

## 2022-07-24 MED ORDER — ALBUTEROL SULFATE HFA 108 (90 BASE) MCG/ACT IN AERS: 2.0000 | INHALATION_SPRAY | RESPIRATORY_TRACT | Status: DC | PRN

## 2022-07-24 MED ORDER — MIDAZOLAM HCL 2 MG/2ML IJ SOLN
INTRAMUSCULAR | Status: DC | PRN
  Administered 2022-07-24: 1 mg via INTRAVENOUS

## 2022-07-24 MED ORDER — SODIUM CHLORIDE 0.9% FLUSH: 3.0000 mL | INTRAVENOUS | Status: DC | PRN

## 2022-07-24 MED ORDER — FLUOXETINE HCL 10 MG PO CAPS
10.0000 mg | ORAL_CAPSULE | Freq: Every day | ORAL | Status: DC
  Administered 2022-07-25: 10 mg via ORAL
  Filled 2022-07-24: qty 1

## 2022-07-24 MED ORDER — ASPIRIN 81 MG PO TBEC
81.0000 mg | DELAYED_RELEASE_TABLET | Freq: Every day | ORAL | Status: DC
  Administered 2022-07-25: 81 mg via ORAL
  Filled 2022-07-24: qty 1

## 2022-07-24 MED ORDER — NICOTINE 21 MG/24HR TD PT24
21.0000 mg | MEDICATED_PATCH | Freq: Every day | TRANSDERMAL | Status: DC
  Administered 2022-07-25: 21 mg via TRANSDERMAL
  Filled 2022-07-24: qty 1

## 2022-07-24 MED ORDER — ASPIRIN 81 MG PO CHEW: 81.0000 mg | CHEWABLE_TABLET | ORAL | Status: AC

## 2022-07-24 MED ORDER — MOMETASONE FURO-FORMOTEROL FUM 200-5 MCG/ACT IN AERO
2.0000 | INHALATION_SPRAY | Freq: Two times a day (BID) | RESPIRATORY_TRACT | Status: DC
  Filled 2022-07-24: qty 8.8

## 2022-07-24 MED ORDER — SODIUM CHLORIDE 0.9% FLUSH: 3.0000 mL | Freq: Two times a day (BID) | INTRAVENOUS | Status: DC

## 2022-07-24 MED ORDER — METOPROLOL TARTRATE 25 MG PO TABS
12.5000 mg | ORAL_TABLET | Freq: Every day | ORAL | Status: DC
  Administered 2022-07-24: 12.5 mg via ORAL
  Filled 2022-07-24: qty 1

## 2022-07-24 MED ORDER — ENOXAPARIN SODIUM 40 MG/0.4ML IJ SOSY
40.0000 mg | PREFILLED_SYRINGE | INTRAMUSCULAR | Status: DC
  Administered 2022-07-25: 40 mg via SUBCUTANEOUS
  Filled 2022-07-24 (×2): qty 0.4

## 2022-07-24 MED ORDER — SODIUM CHLORIDE 0.9% FLUSH
3.0000 mL | Freq: Two times a day (BID) | INTRAVENOUS | Status: DC
  Administered 2022-07-25: 3 mL via INTRAVENOUS

## 2022-07-24 MED ORDER — SODIUM CHLORIDE 0.9 % WEIGHT BASED INFUSION
3.0000 mL/kg/h | INTRAVENOUS | Status: DC
  Administered 2022-07-24: 3 mL/kg/h via INTRAVENOUS

## 2022-07-24 MED ORDER — ENOXAPARIN SODIUM 40 MG/0.4ML IJ SOSY: 40.0000 mg | PREFILLED_SYRINGE | INTRAMUSCULAR | Status: DC

## 2022-07-24 MED ORDER — LABETALOL HCL 5 MG/ML IV SOLN: 10.0000 mg | INTRAVENOUS | Status: AC | PRN

## 2022-07-24 MED ORDER — HEPARIN (PORCINE) IN NACL 1000-0.9 UT/500ML-% IV SOLN
INTRAVENOUS | Status: AC
  Filled 2022-07-24: qty 1000

## 2022-07-24 MED ORDER — SODIUM CHLORIDE 0.9 % IV SOLN: 250.0000 mL | INTRAVENOUS | Status: DC | PRN

## 2022-07-24 MED ORDER — LIDOCAINE HCL (PF) 1 % IJ SOLN
INTRAMUSCULAR | Status: DC | PRN
  Administered 2022-07-24: 2 mL

## 2022-07-24 MED ORDER — FENTANYL CITRATE (PF) 100 MCG/2ML IJ SOLN
INTRAMUSCULAR | Status: DC | PRN
  Administered 2022-07-24: 25 ug via INTRAVENOUS

## 2022-07-24 MED ORDER — ASPIRIN 81 MG PO CHEW
324.0000 mg | CHEWABLE_TABLET | Freq: Once | ORAL | Status: AC
  Administered 2022-07-24: 324 mg via ORAL

## 2022-07-24 MED ORDER — ATORVASTATIN CALCIUM 80 MG PO TABS
80.0000 mg | ORAL_TABLET | Freq: Every day | ORAL | Status: DC
  Administered 2022-07-25: 80 mg via ORAL
  Filled 2022-07-24: qty 1

## 2022-07-24 MED ORDER — IOHEXOL 300 MG/ML  SOLN
INTRAMUSCULAR | Status: DC | PRN
  Administered 2022-07-24: 80 mL

## 2022-07-24 MED ORDER — ALBUTEROL SULFATE (2.5 MG/3ML) 0.083% IN NEBU
2.5000 mg | INHALATION_SOLUTION | Freq: Four times a day (QID) | RESPIRATORY_TRACT | Status: DC | PRN
  Administered 2022-07-24: 2.5 mg via RESPIRATORY_TRACT
  Filled 2022-07-24: qty 3

## 2022-07-24 MED ORDER — LIDOCAINE HCL 1 % IJ SOLN
INTRAMUSCULAR | Status: AC
  Filled 2022-07-24: qty 20

## 2022-07-24 MED ORDER — ASPIRIN 81 MG PO CHEW
CHEWABLE_TABLET | ORAL | Status: AC
  Administered 2022-07-24: 81 mg via ORAL
  Filled 2022-07-24: qty 1

## 2022-07-24 MED ORDER — NITROGLYCERIN 0.4 MG SL SUBL
SUBLINGUAL_TABLET | SUBLINGUAL | Status: AC
  Filled 2022-07-24: qty 1

## 2022-07-24 MED ORDER — ACETAMINOPHEN 325 MG PO TABS: 650.0000 mg | ORAL_TABLET | Freq: Four times a day (QID) | ORAL | Status: DC | PRN

## 2022-07-24 MED ORDER — SODIUM CHLORIDE 0.9 % WEIGHT BASED INFUSION: 1.0000 mL/kg/h | INTRAVENOUS | Status: DC

## 2022-07-24 MED ORDER — ACETAMINOPHEN 325 MG PO TABS
ORAL_TABLET | ORAL | Status: AC
  Filled 2022-07-24: qty 1

## 2022-07-24 MED ORDER — VERAPAMIL HCL 2.5 MG/ML IV SOLN
INTRAVENOUS | Status: DC | PRN
  Administered 2022-07-24: 2.5 mg via INTRA_ARTERIAL

## 2022-07-24 MED ORDER — NITROGLYCERIN 0.4 MG SL SUBL
0.4000 mg | SUBLINGUAL_TABLET | SUBLINGUAL | Status: DC | PRN
  Administered 2022-07-24: 0.4 mg via SUBLINGUAL

## 2022-07-24 MED ORDER — HYDRALAZINE HCL 20 MG/ML IJ SOLN: 5.0000 mg | INTRAMUSCULAR | Status: DC | PRN

## 2022-07-24 MED ORDER — VITAMIN D (ERGOCALCIFEROL) 1.25 MG (50000 UNIT) PO CAPS
50000.0000 [IU] | ORAL_CAPSULE | ORAL | Status: DC
  Filled 2022-07-24: qty 1

## 2022-07-24 MED ORDER — ISOSORBIDE MONONITRATE ER 30 MG PO TB24
30.0000 mg | ORAL_TABLET | Freq: Every day | ORAL | Status: DC
  Administered 2022-07-25: 30 mg via ORAL
  Filled 2022-07-24: qty 1

## 2022-07-24 MED ORDER — DM-GUAIFENESIN ER 30-600 MG PO TB12: 1.0000 | ORAL_TABLET | Freq: Two times a day (BID) | ORAL | Status: DC | PRN

## 2022-07-24 MED ORDER — NITROGLYCERIN 0.4 MG SL SUBL: 0.4000 mg | SUBLINGUAL_TABLET | SUBLINGUAL | Status: DC | PRN

## 2022-07-24 MED ORDER — INSULIN ASPART 100 UNIT/ML IJ SOLN: 0.0000 [IU] | Freq: Every day | INTRAMUSCULAR | Status: DC

## 2022-07-24 MED ORDER — HEPARIN (PORCINE) IN NACL 1000-0.9 UT/500ML-% IV SOLN
INTRAVENOUS | Status: DC | PRN
  Administered 2022-07-24 (×2): 500 mL

## 2022-07-24 MED ORDER — LORATADINE 10 MG PO TABS
10.0000 mg | ORAL_TABLET | Freq: Every day | ORAL | Status: DC
  Administered 2022-07-25: 10 mg via ORAL
  Filled 2022-07-24: qty 1

## 2022-07-24 MED ORDER — MIDAZOLAM HCL 2 MG/2ML IJ SOLN
INTRAMUSCULAR | Status: AC
  Filled 2022-07-24: qty 2

## 2022-07-24 MED ORDER — MORPHINE SULFATE (PF) 4 MG/ML IV SOLN: 2.0000 mg | INTRAVENOUS | Status: DC | PRN

## 2022-07-24 MED ORDER — HEPARIN SODIUM (PORCINE) 1000 UNIT/ML IJ SOLN
INTRAMUSCULAR | Status: DC | PRN
  Administered 2022-07-24: 3000 [IU] via INTRAVENOUS

## 2022-07-24 MED ORDER — ONDANSETRON HCL 4 MG/2ML IJ SOLN: 4.0000 mg | Freq: Three times a day (TID) | INTRAMUSCULAR | Status: DC | PRN

## 2022-07-24 MED ORDER — INSULIN ASPART 100 UNIT/ML IJ SOLN
0.0000 [IU] | Freq: Three times a day (TID) | INTRAMUSCULAR | Status: DC
  Administered 2022-07-24 – 2022-07-25 (×2): 1 [IU] via SUBCUTANEOUS
  Filled 2022-07-24 (×2): qty 1

## 2022-07-24 MED ORDER — VERAPAMIL HCL 2.5 MG/ML IV SOLN
INTRAVENOUS | Status: AC
  Filled 2022-07-24: qty 2

## 2022-07-24 MED ORDER — HEPARIN SODIUM (PORCINE) 1000 UNIT/ML IJ SOLN
INTRAMUSCULAR | Status: AC
  Filled 2022-07-24: qty 10

## 2022-07-24 MED ORDER — FENTANYL CITRATE (PF) 100 MCG/2ML IJ SOLN
INTRAMUSCULAR | Status: AC
  Filled 2022-07-24: qty 2

## 2022-07-24 MED ORDER — HYDRALAZINE HCL 20 MG/ML IJ SOLN: 10.0000 mg | INTRAMUSCULAR | Status: AC | PRN

## 2022-07-24 MED ORDER — SODIUM CHLORIDE 0.9 % WEIGHT BASED INFUSION: 1.0000 mL/kg/h | INTRAVENOUS | Status: AC

## 2022-07-24 MED ORDER — ACETAMINOPHEN 325 MG PO TABS
650.0000 mg | ORAL_TABLET | ORAL | Status: DC | PRN
  Administered 2022-07-24: 650 mg via ORAL

## 2022-07-24 SURGICAL SUPPLY — 11 items
BAND ZEPHYR COMPRESS 30 LONG (HEMOSTASIS) IMPLANT
CATH 5FR JL3.5 JR4 ANG PIG MP (CATHETERS) IMPLANT
DRAPE BRACHIAL (DRAPES) IMPLANT
GLIDESHEATH SLEND SS 6F .021 (SHEATH) IMPLANT
GUIDEWIRE INQWIRE 1.5J.035X260 (WIRE) IMPLANT
INQWIRE 1.5J .035X260CM (WIRE) ×1
PACK CARDIAC CATH (CUSTOM PROCEDURE TRAY) ×1 IMPLANT
PROTECTION STATION PRESSURIZED (MISCELLANEOUS) ×1
SET ATX SIMPLICITY (MISCELLANEOUS) IMPLANT
STATION PROTECTION PRESSURIZED (MISCELLANEOUS) IMPLANT
WIRE HITORQ VERSACORE ST 145CM (WIRE) IMPLANT

## 2022-07-24 NOTE — H&P (Signed)
History and Physical    Jason Burke AVW:[id number] DOB: Nov 24, 1955 DOA: 07/24/2022  Referring MD/NP/PA:   PCP: Patient, No Pcp Per   Patient coming from:  The patient is coming from home.  At baseline, pt is independent for most of ADL.        Chief Complaint: chest pain  HPI: Jason Burke is a 66 y.o. male with medical history significant of HTN, HLD, DM, CAD (s/p of CBAG and stent), bicuspid aortic valve, depression, tobacco abuse, who present with chest pain.  Pt states that he has intermittent chest pain for several weeks. His chest pain has worsened since this AM. It is located in substernal area, initially 9-10/10 in severity, non-radiating, aching. Pt has mild cough with little sputum production. He has mild SOB. No fever or chills. Pt states that he has intermittent diarrhea in the past several days. He had one diarrhea in this AM. No Nausea, vomiting or abdominal pain. No symptoms of UTI. Dr. Herbie BaltimoreHarding of card is consulted, who did cardiac cath for pt. Currently pt's chest pain is mild, 1-2/10 in severity.    Cardiac cath by Dr. Herbie BaltimoreHarding:  POST-OPERATIVE DIAGNOSIS:  * No post-op diagnosis entered * Moderate to large caliber RCA with widely patent distal mid stent (2.7 mm) followed by a 60% stenosis in bend of questionable significance prior to bifurcation into RPDA and RPA V with no distal disease. Very small caliber left coronary system with roughly 1 to 1.5 mm vessels.   The LAD is diffusely diseased with angulations.  There is ostial 30% stenosis several areas in bands of at least 30% with the most significant bend being a 60% stenosis leading up to the tented portion where the previous LIMA insertion site was.  Remainder the vessel tapers to the apex Intermedius is actually at least as big if not bigger than the LAD.  It bifurcates distally into 2 major branches. LCx gives rise to major OM 1 that bifurcates followed by an OM 2/LPL 1 small AV branch.  Relatively 1.5 mm  vessel. Atretic LIMA is very small caliber. 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  Difficult to assess but did not appear he had to have significant pressure gradients. Moderately elevated EDP.     Data reviewed independently and ED Course: pt was found to have WBC 5.5, troponin level 12, electrolytes renal function okay. Blood pressure 155/69, heart rate 51, RR 16, oxygen saturation 100% on room air.  Pending chest x-ray.  Patient is placed on PCU for observation.  EKG: I have personally reviewed. sinus rhythm, QTc 396, LAE, mild ST depression in inferior leads and precordial leads.  The repeated EKG showed T wave inversion in inferior leads and V5-V6.   Review of Systems:   General: no fevers, chills, no body weight gain, has fatigue HEENT: no blurry vision, hearing changes or sore throat Respiratory: has dyspnea, coughing, no wheezing CV: has chest pain, no palpitations GI: no nausea, vomiting, abdominal pain, has diarrhea, no constipation GU: no dysuria, burning on urination, increased urinary frequency, hematuria  Ext: no leg edema Neuro: no unilateral weakness, numbness, or tingling, no vision change or hearing loss Skin: no rash, no skin tear. MSK: No muscle spasm, no deformity, no limitation of range of movement in spin Heme: No easy bruising.  Travel history: No recent long distant travel.   Allergy:  Allergies  Allergen Reactions   Methocarbamol Palpitations    Past Medical History:  Diagnosis Date   Bicuspid aortic valve    Coronary artery disease    Diabetes mellitus without complication (HCC)    Hyperlipidemia    Hypertension    MI (myocardial infarction) (HCC) 09/08/2006    Past Surgical History:  Procedure Laterality Date   CARDIAC CATHETERIZATION     CORONARY ANGIOPLASTY  2007   stent x 2   CORONARY ARTERY BYPASS GRAFT  2008    x 3   RENAL ARTERY STENT  2007   Left kidney    Social History:  reports that he has been smoking cigarettes. He has a 55.00 pack-year smoking history. He has never used smokeless tobacco. He reports that he does not drink alcohol and does not use drugs.  Family History:  Family History  Problem Relation Age of Onset   Heart disease Mother 65       < age 46   Heart disease Father 19   Alcoholism Father      Prior to Admission medications   Medication Sig Start Date End Date Taking? Authorizing Provider  aspirin EC 81 MG tablet Take 1 tablet (81 mg total) by mouth daily. Swallow whole. 01/23/22  Yes End, Cristal Deer, MD  atorvastatin (LIPITOR) 80 MG tablet Take 80 mg by mouth daily.   Yes [provider]  cetirizine (ZYRTEC) 10 MG tablet Take 10 mg by mouth daily.   Yes [provider]  FLUoxetine (PROZAC) 10 MG capsule Take 10 mg by mouth daily.   Yes [provider]  fluticasone-salmeterol (ADVAIR) 250-50 MCG/ACT AEPB Inhale 1 puff into the lungs 2 (two) times daily as needed.   Yes [provider]  isosorbide mononitrate (IMDUR) 30 MG 24 hr tablet Take 0.5 tablets (15 mg total) by mouth daily. 01/23/22  Yes End, Cristal Deer, MD  losartan (COZAAR) 25 MG tablet Take 25 mg by mouth daily. 12/12/20  Yes [provider]  metFORMIN (GLUMETZA) 500 MG (MOD) 24 hr tablet Take 500 mg by mouth daily with breakfast.   Yes [provider]  metoprolol tartrate (LOPRESSOR) 25 MG tablet Take 12.5 mg by mouth daily.   Yes [provider]  nitroGLYCERIN (NITROSTAT) 0.4 MG SL tablet Place 0.4 mg under the tongue every 5 (five) minutes as needed for chest pain.   Yes [provider]  VENTOLIN HFA 108 (90 Base) MCG/ACT inhaler Inhale      2 puffs every 4 hours as needed for wheeze, cough, or SOB. (PLEASE SUBSTITUTE INSURANCE PREFERRED INHALER) 12/13/20  Yes [provider]  Vitamin D, Ergocalciferol, (DRISDOL) 1.25 MG (50000  UNIT) CAPS capsule Take 50,000 Units by mouth every 7 (seven) days. Monday   Yes [provider]    Physical Exam: Vitals:   07/24/22 1530 07/24/22 1542 07/24/22 1615 07/24/22 1930  BP:   (!) 145/73 131/73  Pulse:   61 (!) 51  Resp: (!) Temp:   98 F (36.7 C) 98.1 F (36.7 C)  TempSrc:   Oral Oral  SpO2:   95% 94%  Height:       General: Not in acute distress HEENT:       Eyes: PERRL, EOMI, no scleral icterus.       ENT: No discharge from the ears and nose, no pharynx injection, no tonsillar enlargement.        Neck: No JVD, no bruit, no mass felt. Heme:  No neck lymph node enlargement. Cardiac: S1/S2, RRR, 2/6 systolic murmurs, No gallops or rubs. Respiratory: No rales, wheezing, rhonchi or rubs. GI: Soft, nondistended, nontender, no rebound pain, no organomegaly, BS present. GU: No hematuria Ext: No pitting leg edema bilaterally. 1+DP/PT pulse bilaterally. Musculoskeletal: No joint deformities, No joint redness or warmth, no limitation of ROM in spin. Skin: No rashes.  Neuro: Alert, oriented X3, cranial nerves II-XII grossly intact, moves all extremities normally.  Psych: Patient is not psychotic, no suicidal or hemocidal ideation.  Labs on Admission: I have personally reviewed following labs and imaging studies  CBC: Recent Labs  Lab 07/24/22 1640  WBC 5.5  HGB 14.2  HCT 40.9  MCV 88.9  PLT 190   Basic Metabolic Panel: Recent Labs  Lab 07/24/22 1640  NA 139  K 3.5  CL 110  CO2 24  GLUCOSE 162*  BUN 15  CREATININE 0.82  CALCIUM 8.0*   GFR: Estimated Creatinine Clearance: 58.5 mL/min (by C-G formula based on SCr of 0.82 mg/dL). Liver Function Tests: No results for input(s): "AST", "ALT", "ALKPHOS", "BILITOT", "PROT", "ALBUMIN" in the last 168 hours. No results for input(s): "LIPASE", "AMYLASE" in the last 168 hours. No results for input(s): "AMMONIA" in the last 168 hours. Coagulation Profile: Recent Labs  Lab 07/24/22 1640  INR  1.1   Cardiac Enzymes: No results for input(s): "CKTOTAL", "CKMB", "CKMBINDEX", "TROPONINI" in the last 168 hours. BNP (last 3 results) No results for input(s): "PROBNP" in the last 8760 hours. HbA1C: No results for input(s): "HGBA1C" in the last 72 hours. CBG: Recent Labs  Lab 07/24/22 1113 07/24/22 1345 07/24/22 1748  GLUCAP 104* 80 147*   Lipid Profile: No results for input(s): "CHOL", "HDL", "LDLCALC", "TRIG", "CHOLHDL", "LDLDIRECT" in the last 72 hours. Thyroid Function Tests: No results for input(s): "TSH", "T4TOTAL", "FREET4", "T3FREE", "THYROIDAB" in the last 72 hours. Anemia Panel: No results for input(s): "VITAMINB12", "FOLATE", "FERRITIN", "TIBC", "IRON", "RETICCTPCT" in the last 72 hours. Urine analysis: No results found for: "COLORURINE", "APPEARANCEUR", "LABSPEC", "PHURINE", "GLUCOSEU", "HGBUR", "BILIRUBINUR", "KETONESUR", "PROTEINUR", "UROBILINOGEN", "NITRITE", "LEUKOCYTESUR" Sepsis Labs: @LABRCNTIP (procalcitonin:4,lacticidven:4) )No results found for this or any previous visit (from the past 240 hour(s)).   Radiological Exams on Admission: CARDIAC CATHETERIZATION  Result Date: 07/24/2022   Mid LAD lesion is 60% stenosed -> had a band.  Prox LAD stent is 10% stenosed.   Ost RCA to Prox RCA lesion is 10% stenosed. Prox RCA lesion is 15% stenosed.   Previously placed Mid RCA stent of unknown type is  widely patent.   Dist RCA lesion is 65% stenosed.   -----------------------------------------------   LIMA-LAD graft was visual is using noninvasive imaging.  The vessel was noted to be small caliber, atretic with mid Graft to Insertion lesion is 100% stenosed.   -----------------------------------------------   LV end diastolic pressure is moderately elevated.   There is mild aortic valve stenosis. POST-OPERATIVE DIAGNOSIS:  Moderate two-vessel disease with patent stents in the proximal LAD and mid RCA.  Mid LAD has a roughly 60% lesion and a very small caliber vessel and  there is a 65% lesion in the distal RCA as a potential culprit for chest pain but does not appear to be flow-limiting.  (If medical management is not satisfactory, could consider PCI of this target.) Atretic LIMA that does not reach the LAD. Moderately elevated EDP. PLAN OF CARE: Admit for overnight observation -> for medicine titration.  We will initially increase Imdur to 30 mg daily and potentially consider Ranexa versus  higher dose of Imdur.  Reassess tomorrow to determine if further titration is required versus consideration of potential staged PCI. Bryan Lemma, MD  ECHOCARDIOGRAM COMPLETE  Result Date: 07/24/2022    ECHOCARDIOGRAM REPORT   Patient Name:   Jason Burke Date of Exam: 07/24/2022 Medical Rec #:  [id number]    Height:       57.0 in Accession #:    [id number]   Weight:       122.0 lb Date of Birth:  Jan 20, 1956    BSA:          1.458 m Patient Age:    66 years     BP:           124/72 mmHg Patient Gender: M            HR:           51 bpm. Exam Location:  [facility] Procedure: 2D Echo, 3D Echo, Cardiac Doppler, Color Doppler and Strain Analysis Indications:    Q23.1 Bicuspid aortic valve  History:        Patient has prior history of Echocardiogram examinations, most                 recent 11/14/2009. CAD and Bicuspid AV, Prior CABG, COPD,                 Signs/Symptoms:Chest Pain and Shortness of Breath; Risk                 Factors:Hypertension, Diabetes, Dyslipidemia and Current Smoker.  Sonographer:    Dondra Prader RVT Referring Phys: 22 CHRISTOPHER END IMPRESSIONS  1. Left ventricular ejection fraction, by estimation, is 55 to 60%. The left ventricle has normal function. The left ventricle has no regional wall motion abnormalities. Left ventricular diastolic parameters are indeterminate. The average left ventricular global longitudinal strain is -12.7 %.  2. Right ventricular systolic function is normal. The right ventricular size is normal.  3. The mitral valve is normal in structure. No  evidence of mitral valve regurgitation. No evidence of mitral stenosis.  4. The aortic valve has an indeterminant number of cusps. Aortic valve regurgitation is not visualized. Aortic valve sclerosis is present, with no evidence of aortic valve stenosis.  5. The inferior vena cava is normal in size with greater than 50% respiratory variability, suggesting right atrial pressure of 3 mmHg. FINDINGS  Left Ventricle: Left ventricular ejection fraction, by estimation, is 55 to 60%. The left ventricle has normal function. The left ventricle has no regional wall motion abnormalities. The average left ventricular global longitudinal strain is -12.7 %. The left ventricular internal cavity size was normal in size. There is no left ventricular hypertrophy. Left ventricular diastolic parameters are indeterminate. Right Ventricle: The right ventricular size is normal. No increase in right ventricular wall thickness. Right ventricular systolic function is normal. Left Atrium: Left atrial size was normal in size. Right Atrium: Right atrial size was normal in size. Pericardium: There is no evidence of pericardial effusion. Mitral Valve: The mitral valve is normal in structure. No evidence of mitral valve regurgitation. No evidence of mitral valve stenosis. Tricuspid Valve: The tricuspid valve is normal in structure. Tricuspid valve regurgitation is not demonstrated. No evidence of tricuspid stenosis. Aortic Valve: The aortic valve has an indeterminant number of cusps. Aortic valve regurgitation is not visualized. Aortic valve sclerosis is present, with no evidence of aortic valve stenosis. Aortic valve mean gradient measures 6.0 mmHg. Aortic valve peak gradient measures 11.3 mmHg.  Aortic valve area, by VTI measures 0.98 cm. Pulmonic Valve: The pulmonic valve was normal in structure. Pulmonic valve regurgitation is not visualized. No evidence of pulmonic stenosis. Aorta: The aortic root is normal in size and structure. Venous: The  inferior vena cava is normal in size with greater than 50% respiratory variability, suggesting right atrial pressure of 3 mmHg. IAS/Shunts: No atrial level shunt detected by color flow Doppler.  LEFT VENTRICLE PLAX 2D LVIDd:         3.10 cm     Diastology LVIDs:         2.10 cm     LV e' medial:    6.53 cm/s LV PW:         0.60 cm     LV E/e' medial:  7.1 LV IVS:        0.80 cm     LV e' lateral:   9.30 cm/s LVOT diam:     1.70 cm     LV E/e' lateral: 5.0 LV SV:         35 LV SV Index:   24          2D Longitudinal Strain LVOT Area:     2.27 cm    2D Strain GLS Avg:     -12.7 %  LV Volumes (MOD) LV vol d, MOD A2C: 41.6 ml 3D Volume EF: LV vol d, MOD A4C: 66.1 ml 3D EF:        63 % LV vol s, MOD A2C: 21.4 ml LV EDV:       104 ml LV vol s, MOD A4C: 20.5 ml LV ESV:       38 ml LV SV MOD A2C:     20.2 ml LV SV:        66 ml LV SV MOD A4C:     66.1 ml LV SV MOD BP:      31.9 ml RIGHT VENTRICLE             IVC RV Basal diam:  2.70 cm     IVC diam: 1.60 cm RV Mid diam:    2.30 cm RV S prime:     10.20 cm/s TAPSE (M-mode): 1.8 cm LEFT ATRIUM             Index        RIGHT ATRIUM          Index LA diam:        2.20 cm 1.51 cm/m   RA Area:     8.85 cm LA Vol (A2C):   14.9 ml 10.22 ml/m  RA Volume:   16.80 ml 11.52 ml/m LA Vol (A4C):   13.0 ml 8.92 ml/m LA Biplane Vol: 15.4 ml 10.56 ml/m  AORTIC VALVE                     PULMONIC VALVE AV Area (Vmax):    1.08 cm      PV Vmax:       0.82 m/s AV Area (Vmean):   1.00 cm      PV Peak grad:  2.7 mmHg AV Area (VTI):     0.98 cm AV Vmax:           168.00 cm/s AV Vmean:          121.000 cm/s AV VTI:            0.358 m AV Peak Grad:      11.3 mmHg  AV Mean Grad:      6.0 mmHg LVOT Vmax:         79.60 cm/s LVOT Vmean:        53.100 cm/s LVOT VTI:          0.154 m LVOT/AV VTI ratio: 0.43  AORTA Ao Root diam: 2.40 cm MITRAL VALVE MV Area (PHT): 2.84 cm    SHUNTS MV Decel Time: 267 msec    Systemic VTI:  0.15 m MV E velocity: 46.30 cm/s  Systemic Diam: 1.70 cm MV A velocity:  44.10 cm/s MV E/A ratio:  1.05 Julien Nordmann MD Electronically signed by Julien Nordmann MD Signature Date/Time: 07/24/2022/2:59:01 PM    Final       Assessment/Plan Principal Problem:   Unstable angina (HCC) Active Problems:   CAD (coronary artery disease)   Essential hypertension   Hyperlipidemia LDL goal <70   Type 2 diabetes mellitus with complication, without long-term current use of insulin (HCC)   Tobacco abuse   Diarrhea   Assessment and Plan:  Unstable angina (HCC) and hx of CAD (coronary artery disease): s/p of CABG and stent placement. Pt underwent cardiac cath by Dr. Herbie Baltimore. No stent is placed.  - place to progressive unit for observation - Trend Trop - prn Nitroglycerin, Morphine, and aspirin, lipitor, imdur - Risk factor stratification: will check FLP and A1C   Essential hypertension -IV hydralazine as needed -Cozaar, metoprolol  Hyperlipidemia LDL goal <70 -Lipitor  Type 2 diabetes mellitus with complication, without long-term current use of insulin (HCC): Recent A1c 6.1, well controlled.  Patient taking metformin at home -Sliding scale insulin  Tobacco abuse -Nicotine patch  Diarrhea: Patient has mild diarrhea.  No fever or leukocytosis.  Low suspicions for C diff -Observe closely.  Patient has worsening diarrhea, will need to check C. difficile.    DVT ppx:  SQ Lovenox  Code Status: Full code  Family Communication:  Yes, patient's  wife  at bed side.    Disposition Plan:  Anticipate discharge back to previous environment  Consults called:  dr. Herbie Baltimore of card  Admission status and Level of care: Telemetry Cardiac:     for obs   Dispo: The patient is from: Home              Anticipated d/c is to: Home              Anticipated d/c date is: 1 day              Patient currently is not medically stable to d/c.    Severity of Illness:  The appropriate patient status for this patient is OBSERVATION. Observation status is judged to be  reasonable and necessary in order to provide the required intensity of service to ensure the patient's safety. The patient's presenting symptoms, physical exam findings, and initial radiographic and laboratory data in the context of their medical condition is felt to place them at decreased risk for further clinical deterioration. Furthermore, it is anticipated that the patient will be medically stable for discharge from the hospital within 2 midnights of admission.        Date of Service 07/24/2022    Lorretta Harp [facility]   If 7PM-7AM, please contact night-coverage [email or web address] 07/24/2022, 7:36 PM

## 2022-07-24 NOTE — Progress Notes (Signed)
Follow-up Outpatient Visit Date: 07/24/2022  Primary Care Provider: Patient, No Pcp Per No address on file  Chief Complaint: Chest pain  HPI:  [name] is a 66 y.o. male with history of coronary artery disease status post multiple PCI's (2007 and 2011) and subsequent CABG (8016; LIMA-LAD) complicated by sternal wound infection requiring removal of a wire, bicuspid aortic valve, COPD, hypertension, hyperlipidemia, type 2 diabetes mellitus, and arthritis, who presents for follow-up of chest pain.  I met him in May, at which time he reported chronic exertional chest pain.  He also recounted an episode that occurred at rest in March for which she took sublingual nitroglycerin and subsequently passed out.  He was seen in the ED with unremarkable work-up.  I was concerned that his symptoms could reflect progressive CAD or aortic stenosis associated with his bicuspid aortic valve.  We agreed to obtain an echocardiogram and then discuss further ischemia testing options.  He was also started on isosorbide mononitrate.  Unfortunately, he was a no-show for his echocardiogram and has not followed up with [place] since then.  Today, Mr. Donaho reports that he has had progressive chest pain with mild activity over the last 6 months.  It has now even been present at rest from time to time.  He notes relief with sublingual nitroglycerin, having taken 4 doses since his ED visit 10 days ago.  He has chronic shortness of breath that is stable.  He has noticed some intermittent lightheadedness, including when walking.  He has not passed out.  He denies edema.  He continues to smoke.  --------------------------------------------------------------------------------------------------  Cardiovascular History & Procedures: Cardiovascular Problems: Coronary artery disease status post PCI's and CABG Bicuspid aortic valve   Risk Factors: known CAD, hypertension, hyperlipidemia, type 2 diabetes mellitus, male gender, age  greater than 28, and tobacco use   Cath/PCI: LHC/PCI (11/13/2009, [facility]): LMCA with 20% ostial stenosis.  LAD with 40% proximal and 60% mid stenoses.  LCx with 30% proximal lesion.  Ramus intermedius with 40% diffuse disease.  RCA with diffuse disease and up to 70% stenosis in the mid vessel.  LIMA-LAD patent.  Successful PCI to mid RCA using Xience 2.75 x 23 mm drug-eluting stent (postdilated to 3.3 mm).   CV Surgery: CABG (2008): LIMA-LAD   EP Procedures and Devices: None   Non-Invasive Evaluation(s): Carotid Doppler (01/11/2021): Mild bilateral ICA stenoses (<40%).  Antegrade vertebral artery flow.  Normal subclavian artery flow hemodynamics. TTE (11/14/2009, [facility]): Normal LV size with mild LVH.  LVEF greater than 55%.  Normal RV size and function.  Bicuspid aortic valve without stenosis.  Recent CV Pertinent Labs: Lab Results  Component Value Date   BNP 115.3 (H) 07/14/2022   K 4.1 07/14/2022   BUN 17 07/14/2022   CREATININE 0.96 07/14/2022    Past medical and surgical history were reviewed and updated in EPIC.  Current Meds  Medication Sig   aspirin EC 81 MG tablet Take 1 tablet (81 mg total) by mouth daily. Swallow whole.   atorvastatin (LIPITOR) 80 MG tablet Take 80 mg by mouth daily.   cetirizine (ZYRTEC) 10 MG tablet Take 10 mg by mouth daily.   FLUoxetine (PROZAC) 10 MG capsule Take 10 mg by mouth daily.   fluticasone-salmeterol (ADVAIR) 250-50 MCG/ACT AEPB Inhale 1 puff into the lungs 2 (two) times daily as needed.   isosorbide mononitrate (IMDUR) 30 MG 24 hr tablet Take 0.5 tablets (15 mg total) by mouth daily.   losartan (COZAAR) 25 MG tablet  Take 25 mg by mouth daily.   metFORMIN (GLUMETZA) 500 MG (MOD) 24 hr tablet Take 500 mg by mouth daily with breakfast.   metoprolol tartrate (LOPRESSOR) 25 MG tablet Take 12.5 mg by mouth daily.   nitroGLYCERIN (NITROSTAT) 0.4 MG SL tablet Place 0.4 mg under the tongue every 5 (five) minutes as needed for chest pain.   VENTOLIN HFA  108 (90 Base) MCG/ACT inhaler Inhale      2 puffs every 4 hours as needed for wheeze, cough, or SOB. (PLEASE SUBSTITUTE INSURANCE PREFERRED INHALER)   Vitamin D, Ergocalciferol, (DRISDOL) 1.25 MG (50000 UNIT) CAPS capsule Take 50,000 Units by mouth every 7 (seven) days. Monday    Allergies: Methocarbamol  Social History   Tobacco Use   Smoking status: Every Day    Packs/day: 1.00    Years: 55.00    Total pack years: 55.00    Types: Cigarettes   Smokeless tobacco: Never   Tobacco comments:    01/23/2022 2-3 cigarettes per day currently  Vaping Use   Vaping Use: Never used  Substance Use Topics   Alcohol use: No   Drug use: No    Family History  Problem Relation Age of Onset   Heart disease Mother 36       < age 90   Heart disease Father 20   Alcoholism Father     Review of Systems: A 12-system review of systems was performed and was negative except as noted in the HPI.  --------------------------------------------------------------------------------------------------  Physical Exam: BP 124/72 (BP Location: Left Arm, Patient Position: Sitting, Cuff Size: Normal)   Pulse 66   Ht _0  (1.448 m)   Wt 122 lb (55.3 kg)   SpO2 95%   BMI 26.40 kg/m   General:  NAD.  He is accompanied by his wife. Neck: No JVD or HJR. Lungs: Diminished breath sounds throughout with scattered wheezes. Heart: Regular rate and rhythm with 2/6 systolic murmur.  S1 and S2 audible. Abdomen: Soft, nontender, nondistended. Extremities: No lower extremity edema.  Left radial pulse is 1+.  EKG:  NSR with inferolateral ST depressions and nonspecific T wave changes.  No significant change since 07/14/2022.  Lab Results  Component Value Date   WBC 7.6 07/14/2022   HGB 15.3 07/14/2022   HCT 44.5 07/14/2022   MCV 88.6 07/14/2022   PLT 205 07/14/2022    Lab Results  Component Value Date   NA 138 07/14/2022   K 4.1 07/14/2022   CL 103 07/14/2022   CO2 29 07/14/2022   BUN 17 07/14/2022    CREATININE 0.96 07/14/2022   GLUCOSE 223 (H) 07/14/2022    --------------------------------------------------------------------------------------------------  ASSESSMENT AND PLAN: Coronary artery disease with unstable angina: Mr. Sookdeo presents with progressive exertional chest pain and some intermittent discomfort at rest as well.  Given his history of multiple PCI's and prior single-vessel CABG, I am concerned that his symptoms reflect unstable angina.  His EKG today shows inferolateral ST/T changes concerning for ischemia.  He is chest pain-free at this time.  We will arrange for cardiac catheterization and possible PCI later today with Dr. Ellyn Hack.  I asked Mr. Oquinn to alert [place] if he has any recurrent chest pain in the meantime.  Bicuspid aortic valve: 2/6 systolic murmur appreciated on exam today.  While some of his symptoms could be related to AS, his cardiac exam is not consistent with critical stenosis of the aortic valve.  Unfortunately, Mr. Missildine did not show for his echocardiogram in May.  We will attempt to obtain an echocardiogram today before the catheterization, though this can be performed afterwards, if necessary.  Hyperlipidemia: Continue atorvastatin 80 mg daily.  Recommend checking lipid panel and LP(a) during admission.  Hypertension: Blood pressure well controlled today.  Continue current regimen of losartan, metoprolol, and isosorbide mononitrate.  Shared Decision Making/Informed Consent The risks [stroke (1 in 1000), death (1 in 1000), kidney failure [usually temporary] (1 in 500), bleeding (1 in 200), allergic reaction [possibly serious] (1 in 200)], benefits (diagnostic support and management of coronary artery disease) and alternatives of a cardiac catheterization were discussed in detail with Mr. Philbin and he is willing to proceed.  Follow-up: Return to clinic in 2-3 weeks.  Nelva Bush, MD 07/24/2022 8:37 AM

## 2022-07-24 NOTE — Interval H&P Note (Signed)
History and Physical Interval Note:  07/24/2022 12:16 PM  Eeshan R Woolford  has presented today for surgery, with the diagnosis of L Cath w graft    Unstable angina.  The various methods of treatment have been discussed with the patient and family. After consideration of risks, benefits and other options for treatment, the patient has consented to  Procedure(s): LEFT HEART CATH AND CORS/GRAFTS ANGIOGRAPHY (Left)  Percutaneous coronary intervention  as a surgical intervention.  The patient's history has been reviewed, patient examined, no change in status, stable for surgery.  I have reviewed the patient's chart and labs.  Questions were answered to the patient's satisfaction.    Cath Lab Visit (complete for each Cath Lab visit)  Clinical Evaluation Leading to the Procedure:   ACS: No.  Non-ACS:    Anginal Classification: CCS IV  Anti-ischemic medical therapy: Maximal Therapy (2 or more classes of medications)  Non-Invasive Test Results: No non-invasive testing performed  Prior CABG: Previous CABG   Bryan Lemma

## 2022-07-24 NOTE — Patient Instructions (Addendum)
Medication Instructions:  Your Physician recommend you continue on your current medication as directed.    *If you need a refill on your cardiac medications before your next appointment, please call your pharmacy*   Lab Work: None ordered today   Testing/Procedures: Your physician has requested that you have an echocardiogram. Echocardiography is a painless test that uses sound waves to create images of your heart. It provides your doctor with information about the size and shape of your heart and how well your heart's chambers and valves are working.   You may receive an ultrasound enhancing agent through an IV if needed to better visualize your heart during the echo. This procedure takes approximately one hour.  There are no restrictions for this procedure.  This will take place at [address] [address], [place] [address]   Your physician has requested that you have a cardiac catheterization. Cardiac catheterization is used to diagnose and/or treat various heart conditions. Doctors may recommend this procedure for a number of different reasons. The most common reason is to evaluate chest pain. Chest pain can be a symptom of coronary artery disease (CAD), and cardiac catheterization can show whether plaque is narrowing or blocking your heart's arteries. This procedure is also used to evaluate the valves, as well as measure the blood flow and oxygen levels in different parts of your heart. For further information please visit [email or web address] Please follow instruction sheet, as given.    Follow-Up: At [facility], you and your health needs are our priority.  As part of our continuing mission to provide you with exceptional heart care, we have created designated Provider Care Teams.  These Care Teams include your primary Cardiologist (physician) and Advanced Practice Providers (APPs -  Physician Assistants and Nurse Practitioners) who all work together to  provide you with the care you need, when you need it.  We recommend signing up for the patient portal called "MyChart".  Sign up information is provided on this After Visit Summary.  MyChart is used to connect with patients for Virtual Visits (Telemedicine).  Patients are able to view lab/test results, encounter notes, upcoming appointments, etc.  Non-urgent messages can be sent to your provider as well.   To learn more about what you can do with MyChart, go to [email or web address].    Your next appointment:   2 week(s)  The format for your next appointment:   In Person  Provider:   You will see one of the following Advanced Practice Providers on your designated Care Team:   Nicolasa Ducking, NP Eula Listen, PA-C Cadence Fransico Michael, PA-C Charlsie Quest, NP    [facility] [address] [place] [address] Dept: [phone] Loc: [phone]   Cardiac/Peripheral Catheterization   You are scheduled for a Cardiac Catheterization on Thursday, November 16 with Dr. Bryan Lemma.  1. Arrive at the [facility] entrance at 10:30 am, one hour prior to your procedure. Free valet service is available.  After entering the [facility] please check-in at the registration desk (1st desk on your right) to receive your armband. After receiving your armband someone will escort you to the cardiac cath/special procedures waiting area. The support person will be asked to wait in the waiting room.  It is OK to have someone drop you off and come back when you are ready to be discharged.        Special note: Every effort is made to have your procedure done on time.  Please understand that emergencies sometimes delay scheduled procedures.   . 2. Diet: Do not eat solid foods after midnight.  You may have clear liquids until 5 AM the day of the procedure.  3. Labs: None ordered today  4. Medication instructions in preparation for your procedure:   Contrast Allergy: No  Do  not take Diabetes Med Glucophage (Metformin) on the day of the procedure and HOLD 48 HOURS AFTER THE PROCEDURE.  On the morning of your procedure, take Aspirin 81 mg and any morning medicines NOT listed above.  You may use sips of water.  5. Plan to go home the same day, you will only stay overnight if medically necessary. 6. You MUST have a responsible adult to drive you home. 7. An adult MUST be with you the first 24 hours after you arrive home. 8. Bring a current list of your medications, and the last time and date medication taken. 9. Bring ID and current insurance cards. 10.Please wear clothes that are easy to get on and off and wear slip-on shoes.  Thank you for allowing [place] to care for you!   -- [facility]

## 2022-07-24 NOTE — Progress Notes (Signed)
Pt administered Aspirin 324 mg in clinic today 11/16 at 9:30 am in preparation for Cath procedure.

## 2022-07-24 NOTE — Brief Op Note (Signed)
    BRIEF CARDIAC CATHETERIZATION NOTE  NAME: [name]   MRN: [id number]  07/24/2022 1:47 PM  CARDIOLOGIST:  Yvonne Kendall, MD  PROCEDURE:  Procedure(s): LEFT HEART CATH AND CORS/GRAFTS ANGIOGRAPHY (Left)  SURGEON:  Surgeon(s) and Role:    * Marykay Lex, MD - Primary  PATIENT:  Jason Burke is a 66 y.o. male with history of coronary artery disease status post multiple PCI's (2007 and 2011) and subsequent CABG (2008; LIMA-LAD) complicated by sternal wound infection requiring removal of a wire, bicuspid aortic valve, COPD, hypertension, hyperlipidemia, type 2 diabetes mellitus, and arthritis, who was seen today by Dr. Okey Dupre in clinic for follow-up of chest pain, concerning for progressive angina.  Based on the presentation he is referred for urgent catheterization.  He also had an echocardiogram done earlier today to evaluate his bicuspid aortic valve.  PRE-OPERATIVE DIAGNOSIS:  L Cath w graft    Unstable angina  POST-OPERATIVE DIAGNOSIS:  * No post-op diagnosis entered * Moderate to large caliber RCA with widely patent distal mid stent (2.7 mm) followed by a 60% stenosis in bend of questionable significance prior to bifurcation into RPDA and RPA V with no distal disease. Very small caliber left coronary system with roughly 1 to 1.5 mm vessels.   The LAD is diffusely diseased with angulations.  There is ostial 30% stenosis several areas in bands of at least 30% with the most significant bend being a 60% stenosis leading up to the tented portion where the previous LIMA insertion site was.  Remainder the vessel tapers to the apex Intermedius is actually at least as big if not bigger than the LAD.  It bifurcates distally into 2 major branches. LCx gives rise to major OM 1 that bifurcates followed by an OM 2/LPL 1 small AV branch.  Relatively 1.5 mm vessel. Atretic LIMA is very small caliber. 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  Difficult to assess but did not appear he had to have significant pressure gradients. Moderately elevated EDP.   ANESTHESIA:   local and IV sedation; 3 mL subcu lidocaine.  1 mg IV Versed, 25 mcg fentanyl  EBL:  <20 mL  BLOOD ADMINIS  DICTATION: .Note written in EPIC  PLAN OF CARE: Admit for overnight observation -> for medicine titration.  We will initially increase Imdur to 30 mg daily and potentially consider Ranexa versus higher dose of Imdur.  Reassess tomorrow to determine if further titration is required versus consideration of potential staged PCI.  PATIENT DISPOSITION:  PACU - hemodynamically stable.   Delay start of Pharmacological VTE agent (>24hrs) due to surgical blood loss or risk of bleeding: not applicable    Bryan Lemma, MD

## 2022-07-24 NOTE — H&P (View-Only) (Signed)
Follow-up Outpatient Visit Date: 07/24/2022  Primary Care Provider: Patient, No Pcp Per No address on file  Chief Complaint: Chest pain  HPI:  [name] is a 66 y.o. male with history of coronary artery disease status post multiple PCI's (2007 and 2011) and subsequent CABG (1505; LIMA-LAD) complicated by sternal wound infection requiring removal of a wire, bicuspid aortic valve, COPD, hypertension, hyperlipidemia, type 2 diabetes mellitus, and arthritis, who presents for follow-up of chest pain.  I met him in May, at which time he reported chronic exertional chest pain.  He also recounted an episode that occurred at rest in March for which she took sublingual nitroglycerin and subsequently passed out.  He was seen in the ED with unremarkable work-up.  I was concerned that his symptoms could reflect progressive CAD or aortic stenosis associated with his bicuspid aortic valve.  We agreed to obtain an echocardiogram and then discuss further ischemia testing options.  He was also started on isosorbide mononitrate.  Unfortunately, he was a no-show for his echocardiogram and has not followed up with [place] since then.  Today, Jason Burke reports that he has had progressive chest pain with mild activity over the last 6 months.  It has now even been present at rest from time to time.  He notes relief with sublingual nitroglycerin, having taken 4 doses since his ED visit 10 days ago.  He has chronic shortness of breath that is stable.  He has noticed some intermittent lightheadedness, including when walking.  He has not passed out.  He denies edema.  He continues to smoke.  --------------------------------------------------------------------------------------------------  Cardiovascular History & Procedures: Cardiovascular Problems: Coronary artery disease status post PCI's and CABG Bicuspid aortic valve   Risk Factors: known CAD, hypertension, hyperlipidemia, type 2 diabetes mellitus, male gender, age  greater than 61, and tobacco use   Cath/PCI: LHC/PCI (11/13/2009, [facility]): LMCA with 20% ostial stenosis.  LAD with 40% proximal and 60% mid stenoses.  LCx with 30% proximal lesion.  Ramus intermedius with 40% diffuse disease.  RCA with diffuse disease and up to 70% stenosis in the mid vessel.  LIMA-LAD patent.  Successful PCI to mid RCA using Xience 2.75 x 23 mm drug-eluting stent (postdilated to 3.3 mm).   CV Surgery: CABG (2008): LIMA-LAD   EP Procedures and Devices: None   Non-Invasive Evaluation(s): Carotid Doppler (01/11/2021): Mild bilateral ICA stenoses (<40%).  Antegrade vertebral artery flow.  Normal subclavian artery flow hemodynamics. TTE (11/14/2009, [facility]): Normal LV size with mild LVH.  LVEF greater than 55%.  Normal RV size and function.  Bicuspid aortic valve without stenosis.  Recent CV Pertinent Labs: Lab Results  Component Value Date   BNP 115.3 (H) 07/14/2022   K 4.1 07/14/2022   BUN 17 07/14/2022   CREATININE 0.96 07/14/2022    Past medical and surgical history were reviewed and updated in EPIC.  Current Meds  Medication Sig   aspirin EC 81 MG tablet Take 1 tablet (81 mg total) by mouth daily. Swallow whole.   atorvastatin (LIPITOR) 80 MG tablet Take 80 mg by mouth daily.   cetirizine (ZYRTEC) 10 MG tablet Take 10 mg by mouth daily.   FLUoxetine (PROZAC) 10 MG capsule Take 10 mg by mouth daily.   fluticasone-salmeterol (ADVAIR) 250-50 MCG/ACT AEPB Inhale 1 puff into the lungs 2 (two) times daily as needed.   isosorbide mononitrate (IMDUR) 30 MG 24 hr tablet Take 0.5 tablets (15 mg total) by mouth daily.   losartan (COZAAR) 25 MG tablet  Take 25 mg by mouth daily.   metFORMIN (GLUMETZA) 500 MG (MOD) 24 hr tablet Take 500 mg by mouth daily with breakfast.   metoprolol tartrate (LOPRESSOR) 25 MG tablet Take 12.5 mg by mouth daily.   nitroGLYCERIN (NITROSTAT) 0.4 MG SL tablet Place 0.4 mg under the tongue every 5 (five) minutes as needed for chest pain.   VENTOLIN HFA  108 (90 Base) MCG/ACT inhaler Inhale      2 puffs every 4 hours as needed for wheeze, cough, or SOB. (PLEASE SUBSTITUTE INSURANCE PREFERRED INHALER)   Vitamin D, Ergocalciferol, (DRISDOL) 1.25 MG (50000 UNIT) CAPS capsule Take 50,000 Units by mouth every 7 (seven) days. Monday    Allergies: Methocarbamol  Social History   Tobacco Use   Smoking status: Every Day    Packs/day: 1.00    Years: 55.00    Total pack years: 55.00    Types: Cigarettes   Smokeless tobacco: Never   Tobacco comments:    01/23/2022 2-3 cigarettes per day currently  Vaping Use   Vaping Use: Never used  Substance Use Topics   Alcohol use: No   Drug use: No    Family History  Problem Relation Age of Onset   Heart disease Mother 16       < age 29   Heart disease Father 22   Alcoholism Father     Review of Systems: A 12-system review of systems was performed and was negative except as noted in the HPI.  --------------------------------------------------------------------------------------------------  Physical Exam: BP 124/72 (BP Location: Left Arm, Patient Position: Sitting, Cuff Size: Normal)   Pulse 66   Ht _0  (1.448 m)   Wt 122 lb (55.3 kg)   SpO2 95%   BMI 26.40 kg/m   General:  NAD.  He is accompanied by his wife. Neck: No JVD or HJR. Lungs: Diminished breath sounds throughout with scattered wheezes. Heart: Regular rate and rhythm with 2/6 systolic murmur.  S1 and S2 audible. Abdomen: Soft, nontender, nondistended. Extremities: No lower extremity edema.  Left radial pulse is 1+.  EKG:  NSR with inferolateral ST depressions and nonspecific T wave changes.  No significant change since 07/14/2022.  Lab Results  Component Value Date   WBC 7.6 07/14/2022   HGB 15.3 07/14/2022   HCT 44.5 07/14/2022   MCV 88.6 07/14/2022   PLT 205 07/14/2022    Lab Results  Component Value Date   NA 138 07/14/2022   K 4.1 07/14/2022   CL 103 07/14/2022   CO2 29 07/14/2022   BUN 17 07/14/2022    CREATININE 0.96 07/14/2022   GLUCOSE 223 (H) 07/14/2022    --------------------------------------------------------------------------------------------------  ASSESSMENT AND PLAN: Coronary artery disease with unstable angina: Mr. Hershman presents with progressive exertional chest pain and some intermittent discomfort at rest as well.  Given his history of multiple PCI's and prior single-vessel CABG, I am concerned that his symptoms reflect unstable angina.  His EKG today shows inferolateral ST/T changes concerning for ischemia.  He is chest pain-free at this time.  We will arrange for cardiac catheterization and possible PCI later today with Dr. Ellyn Hack.  I asked Mr. Springborn to alert [place] if he has any recurrent chest pain in the meantime.  Bicuspid aortic valve: 2/6 systolic murmur appreciated on exam today.  While some of his symptoms could be related to AS, his cardiac exam is not consistent with critical stenosis of the aortic valve.  Unfortunately, Mr. Schnabel did not show for his echocardiogram in May.  We will attempt to obtain an echocardiogram today before the catheterization, though this can be performed afterwards, if necessary.  Hyperlipidemia: Continue atorvastatin 80 mg daily.  Recommend checking lipid panel and LP(a) during admission.  Hypertension: Blood pressure well controlled today.  Continue current regimen of losartan, metoprolol, and isosorbide mononitrate.  Shared Decision Making/Informed Consent The risks [stroke (1 in 1000), death (1 in 1000), kidney failure [usually temporary] (1 in 500), bleeding (1 in 200), allergic reaction [possibly serious] (1 in 200)], benefits (diagnostic support and management of coronary artery disease) and alternatives of a cardiac catheterization were discussed in detail with Mr. Prosch and he is willing to proceed.  Follow-up: Return to clinic in 2-3 weeks.  Nelva Bush, MD 07/24/2022 8:37 AM

## 2022-07-25 ENCOUNTER — Encounter: Payer: Self-pay | Admitting: Cardiology

## 2022-07-25 DIAGNOSIS — Z951 Presence of aortocoronary bypass graft: Secondary | ICD-10-CM

## 2022-07-25 DIAGNOSIS — E782 Mixed hyperlipidemia: Secondary | ICD-10-CM | POA: Diagnosis not present

## 2022-07-25 DIAGNOSIS — I200 Unstable angina: Secondary | ICD-10-CM | POA: Diagnosis not present

## 2022-07-25 DIAGNOSIS — I25118 Atherosclerotic heart disease of native coronary artery with other forms of angina pectoris: Secondary | ICD-10-CM | POA: Diagnosis not present

## 2022-07-25 DIAGNOSIS — I25110 Atherosclerotic heart disease of native coronary artery with unstable angina pectoris: Secondary | ICD-10-CM | POA: Diagnosis not present

## 2022-07-25 DIAGNOSIS — E118 Type 2 diabetes mellitus with unspecified complications: Secondary | ICD-10-CM | POA: Diagnosis not present

## 2022-07-25 LAB — CBC
HCT: 43.6 % (ref 39.0–52.0)
Hemoglobin: 15.1 g/dL (ref 13.0–17.0)
MCH: 30.9 pg (ref 26.0–34.0)
MCHC: 34.6 g/dL (ref 30.0–36.0)
MCV: 89.3 fL (ref 80.0–100.0)
Platelets: 204 10*3/uL (ref 150–400)
RBC: 4.88 MIL/uL (ref 4.22–5.81)
RDW: 12.9 % (ref 11.5–15.5)
WBC: 7.2 10*3/uL (ref 4.0–10.5)
nRBC: 0 % (ref 0.0–0.2)

## 2022-07-25 LAB — HIV ANTIBODY (ROUTINE TESTING W REFLEX): HIV Screen 4th Generation wRfx: NONREACTIVE

## 2022-07-25 LAB — LIPID PANEL
Cholesterol: 153 mg/dL (ref 0–200)
HDL: 56 mg/dL (ref >40)
LDL Cholesterol: 90 mg/dL (ref 0–99)
Total CHOL/HDL Ratio: 2.7 RATIO
Triglycerides: 37 mg/dL (ref <150)
VLDL: 7 mg/dL (ref 0–40)

## 2022-07-25 LAB — GLUCOSE, CAPILLARY
Glucose-Capillary: 120 mg/dL — ABNORMAL HIGH (ref 70–99)
Glucose-Capillary: 146 mg/dL — ABNORMAL HIGH (ref 70–99)

## 2022-07-25 MED ORDER — EZETIMIBE 10 MG PO TABS: 10.0000 mg | ORAL_TABLET | Freq: Every day | ORAL | 0 refills | Status: AC

## 2022-07-25 MED ORDER — ISOSORBIDE MONONITRATE ER 30 MG PO TB24: 30.0000 mg | ORAL_TABLET | Freq: Every day | ORAL | 1 refills | Status: AC

## 2022-07-25 MED ORDER — EZETIMIBE 10 MG PO TABS
10.0000 mg | ORAL_TABLET | Freq: Every day | ORAL | Status: DC
  Administered 2022-07-25: 10 mg via ORAL
  Filled 2022-07-25: qty 1

## 2022-07-25 NOTE — Progress Notes (Signed)
Mobility Specialist - Progress Note     07/25/22 1000  Mobility  Activity Ambulated independently in hallway;Stood at bedside  Level of Assistance Independent  Assistive Device None  Distance Ambulated (ft) 160 ft  Activity Response Tolerated well  Mobility Referral Yes  $Mobility charge 1 Mobility   Pt resting in bed on RA upon arrival. Pt STS and ambulates in hallway indep. Pt expresses no lightheadedness or SOB during mobility session. Pt returns to bed and left with needs in reach. Pt bed alarm activated and wife present in room.   Jason Burke Mobility Specialist 07/25/22, 10:51 AM

## 2022-07-25 NOTE — Progress Notes (Signed)
  Transition of Care (TOC) Screening Note   Patient Details  Name: Jason Burke Date of Birth: 10-Dec-1955   Transition of Care [facility]) CM/SW Contact:    Colin Broach, LCSW Phone Number: 07/25/2022, 9:04 AM  Transition of Care Department [facility]) has reviewed patient and no TOC needs have been identified at this time. We will continue to monitor patient advancement through interdisciplinary progression rounds. If new patient transition needs arise, please place a TOC consult.   [address] [place], LCSW [phone]

## 2022-07-25 NOTE — Progress Notes (Signed)
Cardiology Progress Note   Patient Name: Jason Burke Date of Encounter: 07/25/2022  Primary Cardiologist: Yvonne Kendall, MD  Subjective   No chest pain or sob.  Ambulated some w/o difficulty or symptoms.  BPs periodically soft, 90's.  Bradycardic on tele.  Inpatient Medications    Scheduled Meds:  aspirin EC  81 mg Oral Daily   atorvastatin  80 mg Oral Daily   enoxaparin (LOVENOX) injection  40 mg Subcutaneous Q24H   FLUoxetine  10 mg Oral Daily   insulin aspart  0-5 Units Subcutaneous QHS   insulin aspart  0-9 Units Subcutaneous TID WC   isosorbide mononitrate  30 mg Oral Daily   loratadine  10 mg Oral Daily   losartan  25 mg Oral Daily   metoprolol tartrate  12.5 mg Oral Daily   mometasone-formoterol  2 puff Inhalation BID   nicotine  21 mg Transdermal Daily   sodium chloride flush  3 mL Intravenous Q12H   [START ON 07/28/2022] Vitamin D (Ergocalciferol)  50,000 Units Oral Q7 days   Continuous Infusions:  sodium chloride     PRN Meds: sodium chloride, acetaminophen, albuterol, dextromethorphan-guaiFENesin, hydrALAZINE, morphine injection, nitroGLYCERIN, nitroGLYCERIN, ondansetron (ZOFRAN) IV, sodium chloride flush   Vital Signs    Vitals:   07/24/22 1615 07/24/22 1930 07/24/22 2313 07/25/22 0325  BP: (!) 145/73 131/73 95/61 (!) 141/56  Pulse: 61 (!) 51 (!) 49 (!) 47  Resp: 18 18 18 18   Temp: 98 F (36.7 C) 98.1 F (36.7 C) 98.1 F (36.7 C) 98.1 F (36.7 C)  TempSrc: Oral Oral Oral   SpO2: 95% 94% 92% 98%  Height:        Intake/Output Summary (Last 24 hours) at 07/25/2022 0911 Last data filed at 07/25/2022 0500 Gross per 24 hour  Intake 919.27 ml  Output 700 ml  Net 219.27 ml   There were no vitals filed for this visit.  Physical Exam   GEN: Well nourished, well developed, in no acute distress.  HEENT: Grossly normal.  Neck: Supple, no JVD, carotid bruits, or masses. Cardiac: RRR, no murmurs, rubs, or gallops. No clubbing, cyanosis, edema.  L  radial cath site mildly ecchymotic w/o bleeding/bruit/hematoma. Radials 2+, DP/PT 2+ and equal bilaterally.  Respiratory:  Respirations regular and unlabored, coarse breath sounds bilat. GI: Soft, nontender, nondistended, BS + x 4. MS: no deformity or atrophy. Skin: warm and dry, no rash. Neuro:  Strength and sensation are intact. Psych: AAOx3.  Normal affect.  Labs    Chemistry Recent Labs  Lab 07/24/22 1640  NA 139  K 3.5  CL 110  CO2 24  GLUCOSE 162*  BUN 15  CREATININE 0.82  CALCIUM 8.0*  GFRNONAA >60  ANIONGAP 5     Hematology Recent Labs  Lab 07/24/22 1640 07/25/22 0550  WBC 5.5 7.2  RBC 4.60 4.88  HGB 14.2 15.1  HCT 40.9 43.6  MCV 88.9 89.3  MCH 30.9 30.9  MCHC 34.7 34.6  RDW 12.9 12.9  PLT 190 204    Cardiac Enzymes  Recent Labs  Lab 07/14/22 1450 07/14/22 1643 07/24/22 1640 07/24/22 1907  TROPONINIHS 8 11 12 11       BNP    Component Value Date/Time   BNP 115.3 (H) 07/14/2022 1450   Lipids  Lab Results  Component Value Date   CHOL 153 07/25/2022   HDL 56 07/25/2022   LDLCALC 90 07/25/2022   TRIG 37 07/25/2022   CHOLHDL 2.7 07/25/2022    HbA1c  Lab Results  Component Value Date   HGBA1C 6.2 (H) 07/24/2022    Radiology    -------------  Telemetry    Sinus brady, mostly 50's, some drops into the 40's - Personally Reviewed  Cardiac Studies   Cardiac Catheterization  11.16.2023  Left Main  Vessel is small.  Left Anterior Descending  Vessel is small.  Prox LAD lesion is 10% stenosed. The lesion was previously treated using a stent (unknown type) over 2 years ago. Previously placed stent displays restenosis.  Mid LAD lesion is 60% stenosed.  First Diagonal Branch  Vessel is small in size.  First Septal Branch  Vessel is small in size.  Third Diagonal Branch  Vessel is small in size.  Ramus Intermedius  Vessel is small.  Left Circumflex  Vessel is small. Vessel is angiographically normal.  First Obtuse Marginal  Branch  Vessel is small in size. Vessel is angiographically normal.  Lateral First Obtuse Marginal Branch  Vessel is small in size.  Right Coronary Artery  Ost RCA to Prox RCA lesion is 10% stenosed.  Prox RCA lesion is 15% stenosed.  Previously placed Mid RCA stent of unknown type is widely patent.  Dist RCA lesion is 65% stenosed.  LIMA LIMA Graft To Mid LAD  LIMA graft was visualized by non-selective angiography and is small. LIMA-LAD atretic, does not reach apex The graft exhibits severe diffuse disease. The graft is atretic. Does not reach the anastomosis to the LAD  Mid Graft to Insertion lesion is 100% stenosed.   Diagnostic Dominance: Right  Patient Profile      66 y.o. male with history of coronary artery disease status post multiple PCI's (2007 and 2011) and subsequent CABG (2008; LIMA-LAD) complicated by sternal wound infection requiring removal of a wire, bicuspid aortic valve, COPD, hypertension, hyperlipidemia, type 2 diabetes mellitus, and arthritis, who was admitted from the office on 11/16 after presenting w/ progressive angina and undergoing diagnostic cath.  Assessment & Plan    1.  USA/CAD:  Pt seen in clinic 11/16 w/ progressive c/p.  He underwent diagnostic cath yesterday afternoon revealing an atretic LIMA  LAD w/ small vessel native LAD dzs (distal to patent LAD stent), patent RCA stent, and moderate distal RCA dzs.  Initial plan for titration of antianginal therapy.  Imdur added, but he has yet to receive a dose.  Feels well this AM, no c/p overnight.  BPs variable since arrival.  He has also been bradycardic.  Will d/c metoprolol (only taking 12.5 mg of tartrate daily) to allow for more BP room.  Provide nitrate this AM, ambulate, and d/c if feeling well.  We will arrange for outpt f/u.  Cont asa, statin, low-dose ARB.  2.  Essential HTN: Somewhat labile and softer this AM.  F/u prior to morning meds.  Follow.  D/c metoprolol in setting of bradycardia into the  40's.  3.  HL:  LDL 90.  Cont statin.  Add zetia.  4.  DMII:  A1c 6.2.  Per IM.  5.  Tob Abuse/COPD:  Still smoking.  Cessation advised.  Signed, Nicolasa Ducking, NP  07/25/2022, 9:11 AM    For questions or updates, please contact   Please consult [email or web address] for contact info under Cardiology/STEMI.

## 2022-07-25 NOTE — Discharge Summary (Signed)
Physician Discharge Summary   Patient: Jason Burke MRN: [id number] DOB: 1956-02-28  Admit date:     07/24/2022  Discharge date: 07/25/22  Discharge Physician: Enedina Finner   PCP: Patient, No Pcp Per   Recommendations at discharge:    F/u [facility] cardiology in 1 week keep log of blood pressure at home  Discharge Diagnoses: Principal Problem:   Unstable angina (HCC) Active Problems:   CAD (coronary artery disease)   Essential hypertension   Hyperlipidemia LDL goal <70   Type 2 diabetes mellitus with complication, without long-term current use of insulin (HCC)   Tobacco abuse   Diarrhea   Hospital Course:  Jason Burke is a 66 y.o. male with medical history significant of HTN, HLD, DM, CAD (s/p of CBAG and stent), bicuspid aortic valve, depression, tobacco abuse, who present with chest pain.  Pt states that he has intermittent chest pain for several weeks. His chest pain has worsened since this AM. It is located in substernal area, initially 9-10/10 in severity, non-radiating, aching.  Unstable angina (HCC) and hx of CAD (coronary artery disease): s/p of CABG and stent placement. Pt underwent cardiac cath by Dr. Herbie Baltimore. No stent is placed. --Trend Trop--negative -- prn Nitroglycerin, aspirin, lipitor, imdur --pt is s/p cath by Dr Herbie Baltimore Cardiac cath by Dr. Herbie Baltimore:  POST-OPERATIVE DIAGNOSIS:  * No post-op diagnosis entered * Moderate to large caliber RCA with widely patent distal mid stent (2.7 mm) followed by a 60% stenosis in bend of questionable significance prior to bifurcation into RPDA and RPA V with no distal disease. Very small caliber left coronary system with roughly 1 to 1.5 mm vessels.   The LAD is diffusely diseased with angulations.  There is ostial 30% stenosis several areas in bands of at least 30% with the most significant bend being a 60% stenosis leading up to the tented portion where the previous LIMA insertion site was.  Remainder the vessel tapers to the  apex Intermedius is actually at least as big if not bigger than the LAD.  It bifurcates distally into 2 major branches. LCx gives rise to major OM 1 that bifurcates followed by an OM 2/LPL 1 small AV branch.  Relatively 1.5 mm vessel. Atretic LIMA is very small caliber. 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  Difficult to assess but did not appear he had to have significant pressure gradients. Moderately elevated EDP. --medical management --increase imdur to 30 mg qd --hold BB due to Bradycardia(pt aysmptomatic)   Essential hypertension -IV hydralazine as needed -Cozaar, metoprolol   Hyperlipidemia LDL goal <70 -Lipitor   Type 2 diabetes mellitus with complication, without long-term current use of insulin (HCC): Recent A1c 6.1, well controlled.  Patient taking metformin at home -Sliding scale insulin   Tobacco abuse -Nicotine patch   Diarrhea: Patient has mild diarrhea.  No fever or leukocytosis.  Low suspicions for C diff   patient ambulated with mobility therapist without any symptoms. Blood pressure stable now. Okay to discharge from cardiology standpoint. Follow-up as outpatient in one week.   DVT ppx:  SQ Lovenox  Code Status: Full code  Family Communication:  Yes, patient's  wife  at bed side.    Disposition Plan:  Anticipate discharge back to previous environment   Consults called:  dr. Jolaine Click of cardiolgy     Procedures performed: [facility]  Disposition: Home Diet  recommendation:  Discharge Diet Orders (From admission, onward)     Start     Ordered   07/25/22 0000  Diet - low sodium heart healthy        07/25/22 1121           Cardiac diet DISCHARGE MEDICATION: Allergies as of 07/25/2022       Reactions   Methocarbamol Palpitations        Medication List     STOP taking these medications    metoprolol tartrate 25  MG tablet Commonly known as: LOPRESSOR       TAKE these medications    aspirin EC 81 MG tablet Take 1 tablet (81 mg total) by mouth daily. Swallow whole.   atorvastatin 80 MG tablet Commonly known as: LIPITOR Take 80 mg by mouth daily.   cetirizine 10 MG tablet Commonly known as: ZYRTEC Take 10 mg by mouth daily.   ezetimibe 10 MG tablet Commonly known as: ZETIA Take 1 tablet (10 mg total) by mouth daily. Start taking on: July 26, 2022   FLUoxetine 10 MG capsule Commonly known as: PROZAC Take 10 mg by mouth daily.   fluticasone-salmeterol 250-50 MCG/ACT Aepb Commonly known as: ADVAIR Inhale 1 puff into the lungs 2 (two) times daily as needed.   isosorbide mononitrate 30 MG 24 hr tablet Commonly known as: IMDUR Take 1 tablet (30 mg total) by mouth daily. What changed: how much to take   losartan 25 MG tablet Commonly known as: COZAAR Take 25 mg by mouth daily.   metFORMIN 500 MG (MOD) 24 hr tablet Commonly known as: GLUMETZA Take 500 mg by mouth daily with breakfast.   nitroGLYCERIN 0.4 MG SL tablet Commonly known as: NITROSTAT Place 0.4 mg under the tongue every 5 (five) minutes as needed for chest pain.   Ventolin HFA 108 (90 Base) MCG/ACT inhaler Generic drug: albuterol Inhale      2 puffs every 4 hours as needed for wheeze, cough, or SOB. (PLEASE SUBSTITUTE INSURANCE PREFERRED INHALER)   Vitamin D (Ergocalciferol) 1.25 MG (50000 UNIT) Caps capsule Commonly known as: DRISDOL Take 50,000 Units by mouth every 7 (seven) days. Monday        Follow-up Information     Creig Hines, NP Follow up on 08/08/2022.   Specialties: Nurse Practitioner, Cardiology, Radiology Why: 10:55 AM Contact information: [address] [place] [address] [phone]                Condition at discharge: fair  The results of significant diagnostics from this hospitalization (including imaging, microbiology, ancillary and  laboratory) are listed below for reference.   Imaging Studies: CARDIAC CATHETERIZATION  Result Date: 07/24/2022   Mid LAD lesion is 60% stenosed -> had a band.  Prox LAD stent is 10% stenosed.   Ost RCA to Prox RCA lesion is 10% stenosed. Prox RCA lesion is 15% stenosed.   Previously placed Mid RCA stent of unknown type is  widely patent.   Dist RCA lesion is 65% stenosed.   -----------------------------------------------   LIMA-LAD graft was visual is using noninvasive imaging.  The vessel was noted to be small caliber, atretic with mid Graft to Insertion lesion is 100% stenosed.   -----------------------------------------------   LV end diastolic pressure is moderately elevated.   There is mild aortic valve stenosis. POST-OPERATIVE DIAGNOSIS:  Moderate two-vessel disease with patent stents in the proximal LAD and mid RCA.  Mid LAD has a roughly 60% lesion and a  very small caliber vessel and there is a 65% lesion in the distal RCA as a potential culprit for chest pain but does not appear to be flow-limiting.  (If medical management is not satisfactory, could consider PCI of this target.) Atretic LIMA that does not reach the LAD. Moderately elevated EDP. PLAN OF CARE: Admit for overnight observation -> for medicine titration.  We will initially increase Imdur to 30 mg daily and potentially consider Ranexa versus higher dose of Imdur.  Reassess tomorrow to determine if further titration is required versus consideration of potential staged PCI. Bryan Lemma, MD  ECHOCARDIOGRAM COMPLETE  Result Date: 07/24/2022    ECHOCARDIOGRAM REPORT   Patient Name:   TREVIONNE ADVANI Date of Exam: 07/24/2022 Medical Rec #:  [id number]    Height:       57.0 in Accession #:    [id number]   Weight:       122.0 lb Date of Birth:  02-17-1956    BSA:          1.458 m Patient Age:    66 years     BP:           124/72 mmHg Patient Gender: M            HR:           51 bpm. Exam Location:  [facility] Procedure: 2D Echo, 3D Echo,  Cardiac Doppler, Color Doppler and Strain Analysis Indications:    Q23.1 Bicuspid aortic valve  History:        Patient has prior history of Echocardiogram examinations, most                 recent 11/14/2009. CAD and Bicuspid AV, Prior CABG, COPD,                 Signs/Symptoms:Chest Pain and Shortness of Breath; Risk                 Factors:Hypertension, Diabetes, Dyslipidemia and Current Smoker.  Sonographer:    Dondra Prader RVT Referring Phys: 87 CHRISTOPHER END IMPRESSIONS  1. Left ventricular ejection fraction, by estimation, is 55 to 60%. The left ventricle has normal function. The left ventricle has no regional wall motion abnormalities. Left ventricular diastolic parameters are indeterminate. The average left ventricular global longitudinal strain is -12.7 %.  2. Right ventricular systolic function is normal. The right ventricular size is normal.  3. The mitral valve is normal in structure. No evidence of mitral valve regurgitation. No evidence of mitral stenosis.  4. The aortic valve has an indeterminant number of cusps. Aortic valve regurgitation is not visualized. Aortic valve sclerosis is present, with no evidence of aortic valve stenosis.  5. The inferior vena cava is normal in size with greater than 50% respiratory variability, suggesting right atrial pressure of 3 mmHg. FINDINGS  Left Ventricle: Left ventricular ejection fraction, by estimation, is 55 to 60%. The left ventricle has normal function. The left ventricle has no regional wall motion abnormalities. The average left ventricular global longitudinal strain is -12.7 %. The left ventricular internal cavity size was normal in size. There is no left ventricular hypertrophy. Left ventricular diastolic parameters are indeterminate. Right Ventricle: The right ventricular size is normal. No increase in right ventricular wall thickness. Right ventricular systolic function is normal. Left Atrium: Left atrial size was normal in size. Right Atrium: Right  atrial size was normal in size. Pericardium: There is no evidence of pericardial effusion. Mitral Valve: The mitral valve  is normal in structure. No evidence of mitral valve regurgitation. No evidence of mitral valve stenosis. Tricuspid Valve: The tricuspid valve is normal in structure. Tricuspid valve regurgitation is not demonstrated. No evidence of tricuspid stenosis. Aortic Valve: The aortic valve has an indeterminant number of cusps. Aortic valve regurgitation is not visualized. Aortic valve sclerosis is present, with no evidence of aortic valve stenosis. Aortic valve mean gradient measures 6.0 mmHg. Aortic valve peak gradient measures 11.3 mmHg. Aortic valve area, by VTI measures 0.98 cm. Pulmonic Valve: The pulmonic valve was normal in structure. Pulmonic valve regurgitation is not visualized. No evidence of pulmonic stenosis. Aorta: The aortic root is normal in size and structure. Venous: The inferior vena cava is normal in size with greater than 50% respiratory variability, suggesting right atrial pressure of 3 mmHg. IAS/Shunts: No atrial level shunt detected by color flow Doppler.  LEFT VENTRICLE PLAX 2D LVIDd:         3.10 cm     Diastology LVIDs:         2.10 cm     LV e' medial:    6.53 cm/s LV PW:         0.60 cm     LV E/e' medial:  7.1 LV IVS:        0.80 cm     LV e' lateral:   9.30 cm/s LVOT diam:     1.70 cm     LV E/e' lateral: 5.0 LV SV:         35 LV SV Index:   24          2D Longitudinal Strain LVOT Area:     2.27 cm    2D Strain GLS Avg:     -12.7 %  LV Volumes (MOD) LV vol d, MOD A2C: 41.6 ml 3D Volume EF: LV vol d, MOD A4C: 66.1 ml 3D EF:        63 % LV vol s, MOD A2C: 21.4 ml LV EDV:       104 ml LV vol s, MOD A4C: 20.5 ml LV ESV:       38 ml LV SV MOD A2C:     20.2 ml LV SV:        66 ml LV SV MOD A4C:     66.1 ml LV SV MOD BP:      31.9 ml RIGHT VENTRICLE             IVC RV Basal diam:  2.70 cm     IVC diam: 1.60 cm RV Mid diam:    2.30 cm RV S prime:     10.20 cm/s TAPSE (M-mode):  1.8 cm LEFT ATRIUM             Index        RIGHT ATRIUM          Index LA diam:        2.20 cm 1.51 cm/m   RA Area:     8.85 cm LA Vol (A2C):   14.9 ml 10.22 ml/m  RA Volume:   16.80 ml 11.52 ml/m LA Vol (A4C):   13.0 ml 8.92 ml/m LA Biplane Vol: 15.4 ml 10.56 ml/m  AORTIC VALVE                     PULMONIC VALVE AV Area (Vmax):    1.08 cm      PV Vmax:       0.82 m/s  AV Area (Vmean):   1.00 cm      PV Peak grad:  2.7 mmHg AV Area (VTI):     0.98 cm AV Vmax:           168.00 cm/s AV Vmean:          121.000 cm/s AV VTI:            0.358 m AV Peak Grad:      11.3 mmHg AV Mean Grad:      6.0 mmHg LVOT Vmax:         79.60 cm/s LVOT Vmean:        53.100 cm/s LVOT VTI:          0.154 m LVOT/AV VTI ratio: 0.43  AORTA Ao Root diam: 2.40 cm MITRAL VALVE MV Area (PHT): 2.84 cm    SHUNTS MV Decel Time: 267 msec    Systemic VTI:  0.15 m MV E velocity: 46.30 cm/s  Systemic Diam: 1.70 cm MV A velocity: 44.10 cm/s MV E/A ratio:  1.05 Julien Nordmann MD Electronically signed by Julien Nordmann MD Signature Date/Time: 07/24/2022/2:59:01 PM    Final    CT Angio Chest PE W/Cm &/Or Wo Cm  Result Date: 07/14/2022 CLINICAL DATA:  Chest pain x6 weeks, headache, shortness of breath EXAM: CT ANGIOGRAPHY CHEST WITH CONTRAST TECHNIQUE: Multidetector CT imaging of the chest was performed using the standard protocol during bolus administration of intravenous contrast. Multiplanar CT image reconstructions and MIPs were obtained to evaluate the vascular anatomy. RADIATION DOSE REDUCTION: This exam was performed according to the departmental dose-optimization program which includes automated exposure control, adjustment of the mA and/or kV according to patient size and/or use of iterative reconstruction technique. CONTRAST:  75mL OMNIPAQUE IOHEXOL 350 MG/ML SOLN COMPARISON:  CT chest dated 04/29/2019 FINDINGS: Cardiovascular: Satisfactory opacification of the bilateral pulmonary arteries to the segmental level. No evidence of  pulmonary embolism. Although not tailored for evaluation of the thoracic aorta, there is no evidence thoracic aortic aneurysm or dissection. Atherosclerotic calcifications of the aortic arch. The heart is normal in size.  No pericardial effusion. Coronary atherosclerosis of the LAD. Mediastinum/Nodes: No suspicious mediastinal lymphadenopathy. Visualized thyroid is unremarkable. Lungs/Pleura: Moderate centrilobular and paraseptal emphysematous changes, upper lung predominant. No focal consolidation. No suspicious pulmonary nodules. No pleural effusion or pneumothorax. Upper Abdomen: Visualized upper abdomen is grossly unremarkable. Musculoskeletal: Median sternotomy. Thoracolumbar spine is within normal limits. Review of the MIP images confirms the above findings. IMPRESSION: No evidence of pulmonary embolism. No evidence of acute cardiopulmonary disease. Aortic Atherosclerosis (ICD10-I70.0) and Emphysema (ICD10-J43.9). Electronically Signed   By: Charline Bills M.D.   On: 07/14/2022 17:33   DG Chest 2 View  Result Date: 07/14/2022 CLINICAL DATA:  Chest pain. EXAM: CHEST - 2 VIEW COMPARISON:  November 28, 2021. FINDINGS: The heart size and mediastinal contours are within normal limits. Both lungs are clear. Sternotomy wires are noted. The visualized skeletal structures are unremarkable. IMPRESSION: No active cardiopulmonary disease. Electronically Signed   By: Lupita Raider M.D.   On: 07/14/2022 15:19    Microbiology: No results found for this or any previous visit.  Labs: CBC: Recent Labs  Lab 07/24/22 1640 07/25/22 0550  WBC 5.5 7.2  HGB 14.2 15.1  HCT 40.9 43.6  MCV 88.9 89.3  PLT 190 204   Basic Metabolic Panel: Recent Labs  Lab 07/24/22 1640  NA 139  K 3.5  CL 110  CO2 24  GLUCOSE 162*  BUN 15  CREATININE 0.82  CALCIUM 8.0*   Liver Function Tests: No results for input(s): "AST", "ALT", "ALKPHOS", "BILITOT", "PROT", "ALBUMIN" in the last 168 hours. CBG: Recent Labs  Lab  07/24/22 1748 07/24/22 2112 07/24/22 2116 07/25/22 0937 07/25/22 1115  GLUCAP 147* 90 96 120* 146*    Discharge time spent: greater than 30 minutes.  Signed: Enedina FinnerSona Maninder Deboer, MD [facility] 07/25/2022

## 2022-07-25 NOTE — Progress Notes (Signed)
Discussed discharge instructions with patient and wife, including medications and follow up appointments.  Instructed patient to monitor blood pressures, HR and O2.   Educated patient on signs of low BP (including dizziness/ lightheadedness upon standing)

## 2022-07-26 LAB — LIPOPROTEIN A (LPA): Lipoprotein (a): 23.7 nmol/L (ref <75.0)

## 2022-07-30 DIAGNOSIS — Z712 Person consulting for explanation of examination or test findings: Secondary | ICD-10-CM | POA: Diagnosis not present

## 2022-07-30 DIAGNOSIS — Z0130 Encounter for examination of blood pressure without abnormal findings: Secondary | ICD-10-CM | POA: Diagnosis not present

## 2022-07-30 DIAGNOSIS — Z1331 Encounter for screening for depression: Secondary | ICD-10-CM | POA: Diagnosis not present

## 2022-07-30 DIAGNOSIS — Z1389 Encounter for screening for other disorder: Secondary | ICD-10-CM | POA: Diagnosis not present

## 2022-07-30 DIAGNOSIS — I10 Essential (primary) hypertension: Secondary | ICD-10-CM | POA: Diagnosis not present

## 2022-07-30 DIAGNOSIS — G3184 Mild cognitive impairment, so stated: Secondary | ICD-10-CM | POA: Diagnosis not present

## 2022-07-30 DIAGNOSIS — I25118 Atherosclerotic heart disease of native coronary artery with other forms of angina pectoris: Secondary | ICD-10-CM | POA: Diagnosis not present

## 2022-08-08 ENCOUNTER — Encounter: Payer: Self-pay | Admitting: Nurse Practitioner

## 2022-08-08 ENCOUNTER — Ambulatory Visit: Payer: Medicare (Managed Care) | Attending: Nurse Practitioner | Admitting: Nurse Practitioner

## 2022-08-08 NOTE — Progress Notes (Deleted)
Office Visit    Patient Name: Jason Burke Date of Encounter: 08/08/2022  Primary Care Provider:  Patient, No Pcp Per Primary Cardiologist:  Yvonne Kendall, MD  Chief Complaint    66 year old male with a history of CAD status post multiple PCI's (2007 in 2011) and CABG x1 in 2008 (LIMA-LAD) complicated by sternal wound infection requiring removal of the wire, bicuspid aortic valve, ongoing tobacco abuse, COPD, hypertension, hyperlipidemia, type 2 diabetes mellitus, and arthritis, who presents for follow-up after recent hospitalization for chest pain and diagnostic catheterization.  Past Medical History    Past Medical History:  Diagnosis Date   Bicuspid aortic valve    Coronary artery disease    Diabetes mellitus without complication (HCC)    Hyperlipidemia    Hypertension    MI (myocardial infarction) (HCC) 09/08/2006   Past Surgical History:  Procedure Laterality Date   CARDIAC CATHETERIZATION     CORONARY ANGIOPLASTY  2007   stent x 2   CORONARY ARTERY BYPASS GRAFT  2008   x 3   LEFT HEART CATH AND CORS/GRAFTS ANGIOGRAPHY Left 07/24/2022   Procedure: LEFT HEART CATH AND CORS/GRAFTS ANGIOGRAPHY;  Surgeon: Marykay Lex, MD;  Location: [facility] INVASIVE CV LAB;  Service: Cardiovascular;  Laterality: Left;   RENAL ARTERY STENT  2007   Left kidney    Allergies  Allergies  Allergen Reactions   Methocarbamol Palpitations    History of Present Illness    66 year old male with the above complex past medical history including CAD status post multiple PCI's, CABG x1 complicated by sternal wound infection requiring removal of the wire, bicuspid aortic valve, COPD, hypertension, hyperlipidemia, ongoing tobacco abuse, type 2 diabetes mellitus, and arthritis.  Notes indicate that he initially underwent PCI in 2007.  He subsequently underwent CABG x1 with LIMA to LAD in 2008.  In the setting of angina, he underwent repeat diagnostic catheterization in March 2011 at [facility],  revealing a patent LIMA to the LAD, and distal RCA disease with successful PCI and drug-eluting stent placement.  He established care here with Dr. Okey Dupre in May 2023 following ED visit for syncope in the setting of chest pain and nitrate usage.  Plan was made for an echocardiogram however, patient did not follow through.  He was then seen on November 16 with complaints of progressive chest pain which led to an ED visit 10 days prior.  EKG showed new inferolateral ST and T changes concerning for ischemia and he was sent over to the hospital for diagnostic catheterization.  This showed a patent proximal LAD stent, atretic LIMA to LAD which was occluded @ the insertion to the LAD, and patent RCA stent with otherwise nonobstructive mid LAD and distal RCA disease.  He was noted to be bradycardic following admission and beta-blocker therapy was discontinued.  Long-acting nitrate was added.  Since his hospitalization,  Home Medications    Current Outpatient Medications  Medication Sig Dispense Refill   aspirin EC 81 MG tablet Take 1 tablet (81 mg total) by mouth daily. Swallow whole.     atorvastatin (LIPITOR) 80 MG tablet Take 80 mg by mouth daily.     cetirizine (ZYRTEC) 10 MG tablet Take 10 mg by mouth daily.     ezetimibe (ZETIA) 10 MG tablet Take 1 tablet (10 mg total) by mouth daily. 90 tablet 0   FLUoxetine (PROZAC) 10 MG capsule Take 10 mg by mouth daily.     fluticasone-salmeterol (ADVAIR) 250-50 MCG/ACT AEPB Inhale 1 puff  into the lungs 2 (two) times daily as needed.     isosorbide mononitrate (IMDUR) 30 MG 24 hr tablet Take 1 tablet (30 mg total) by mouth daily. 30 tablet 1   losartan (COZAAR) 25 MG tablet Take 25 mg by mouth daily.     metFORMIN (GLUMETZA) 500 MG (MOD) 24 hr tablet Take 500 mg by mouth daily with breakfast.     nitroGLYCERIN (NITROSTAT) 0.4 MG SL tablet Place 0.4 mg under the tongue every 5 (five) minutes as needed for chest pain.     VENTOLIN HFA 108 (90 Base) MCG/ACT inhaler  Inhale      2 puffs every 4 hours as needed for wheeze, cough, or SOB. (PLEASE SUBSTITUTE INSURANCE PREFERRED INHALER)     Vitamin D, Ergocalciferol, (DRISDOL) 1.25 MG (50000 UNIT) CAPS capsule Take 50,000 Units by mouth every 7 (seven) days. Monday     No current facility-administered medications for this visit.     Review of Systems    ***.  All other systems reviewed and are otherwise negative except as noted above.    Physical Exam    VS:  There were no vitals taken for this visit. , BMI There is no height or weight on file to calculate BMI.     GEN: Well nourished, well developed, in no acute distress. HEENT: normal. Neck: Supple, no JVD, carotid bruits, or masses. Cardiac: RRR, no murmurs, rubs, or gallops. No clubbing, cyanosis, edema.  Radials 2+/PT 2+ and equal bilaterally.  Respiratory:  Respirations regular and unlabored, clear to auscultation bilaterally. GI: Soft, nontender, nondistended, BS + x 4. MS: no deformity or atrophy. Skin: warm and dry, no rash. Neuro:  Strength and sensation are intact. Psych: Normal affect.  Accessory Clinical Findings    ECG personally reviewed by me today - *** - no acute changes.  Lab Results  Component Value Date   WBC 7.2 07/25/2022   HGB 15.1 07/25/2022   HCT 43.6 07/25/2022   MCV 89.3 07/25/2022   PLT 204 07/25/2022   Lab Results  Component Value Date   CREATININE 0.82 07/24/2022   BUN 15 07/24/2022   NA 139 07/24/2022   K 3.5 07/24/2022   CL 110 07/24/2022   CO2 24 07/24/2022   No results found for: "ALT", "AST", "GGT", "ALKPHOS", "BILITOT" Lab Results  Component Value Date   CHOL 153 07/25/2022   HDL 56 07/25/2022   LDLCALC 90 07/25/2022   TRIG 37 07/25/2022   CHOLHDL 2.7 07/25/2022    Lab Results  Component Value Date   HGBA1C 6.2 (H) 07/24/2022    Assessment & Plan    1.  ***   Nicolasa Ducking, NP 08/08/2022, 9:45 AM

## 2022-08-25 IMAGING — CR DG CHEST 2V
2 series · 2 of 2 positions shown · non-contrast
Comparison: August 29, 2014.

CLINICAL DATA: Chest pain.

EXAM:
CHEST - 2 VIEW

[chest lat]
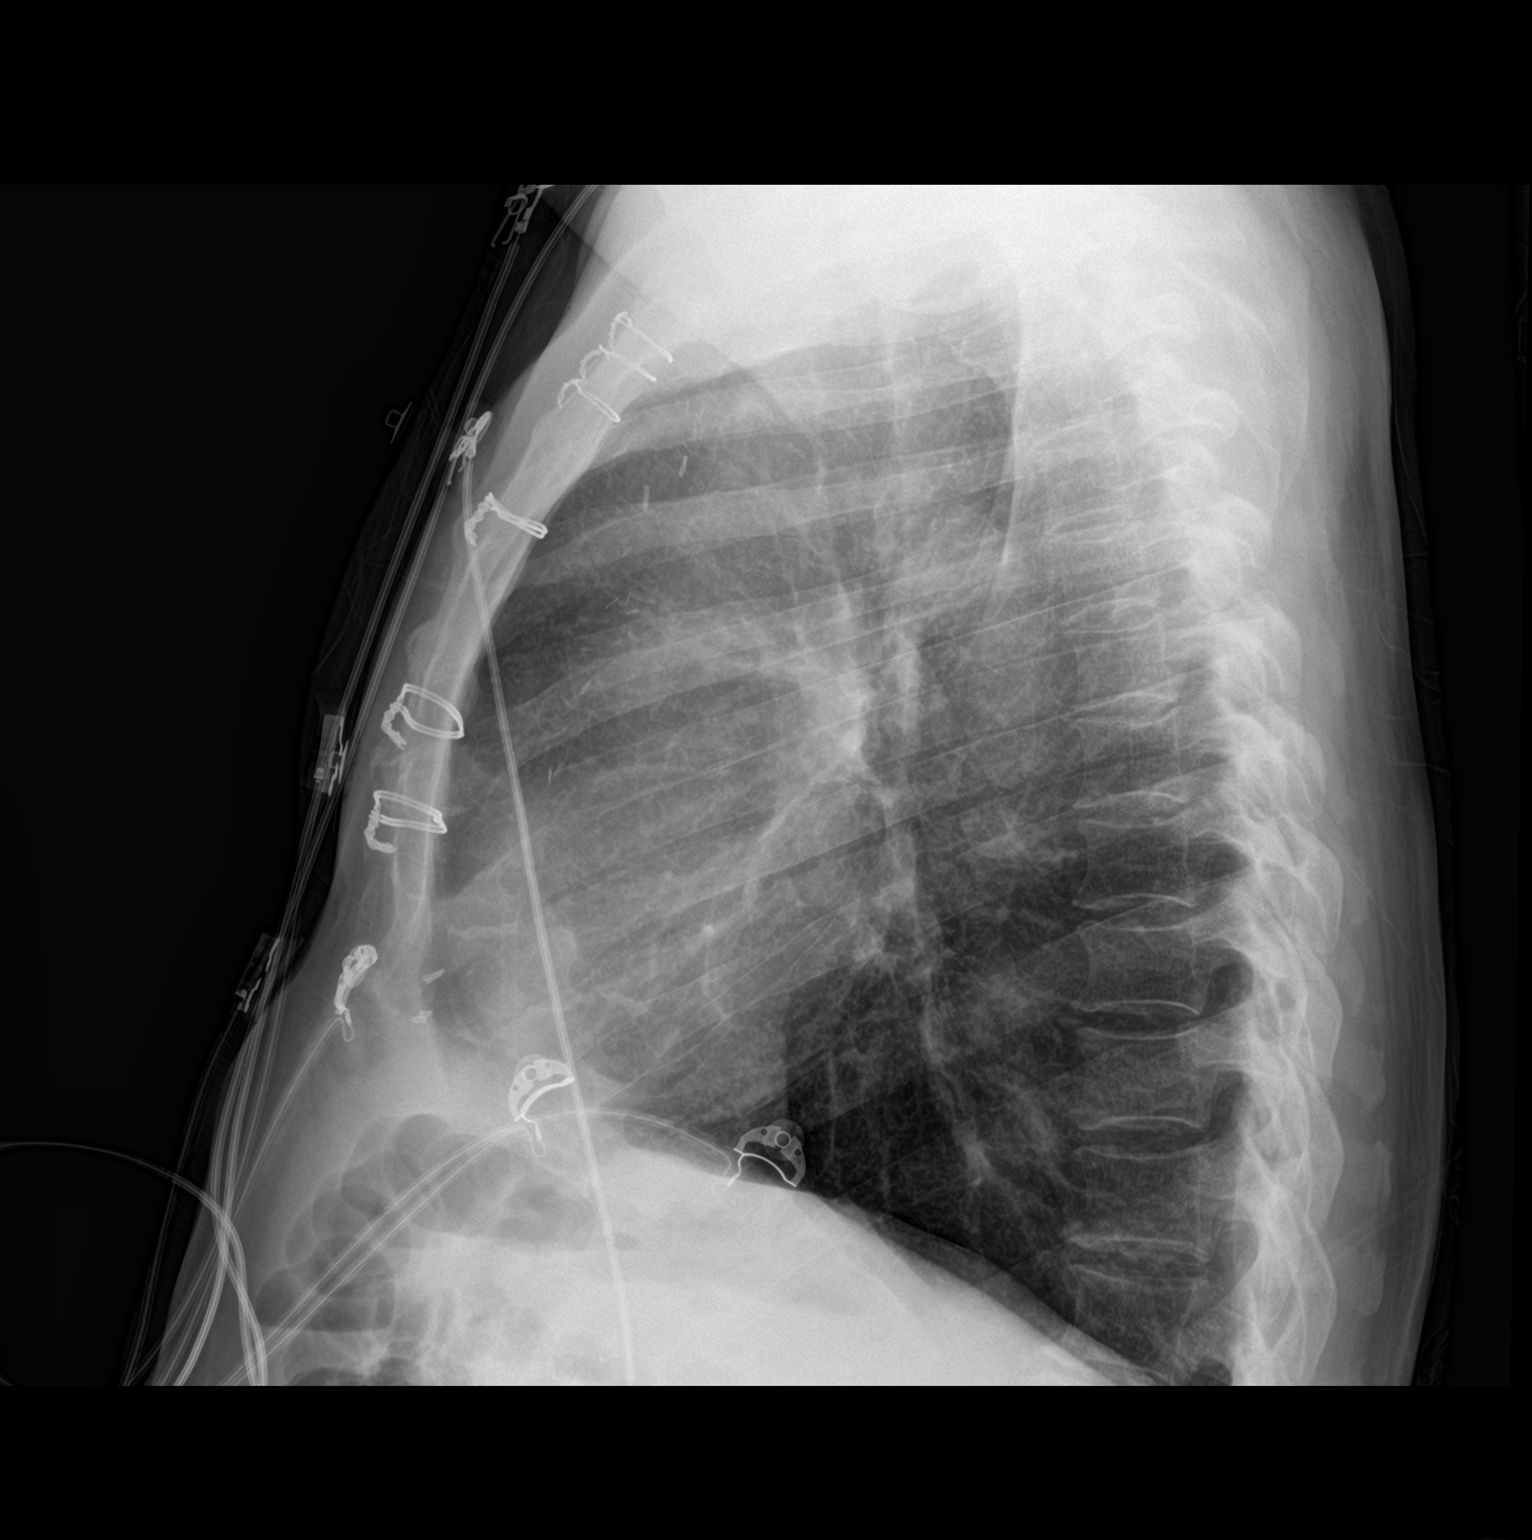

[chest ap]
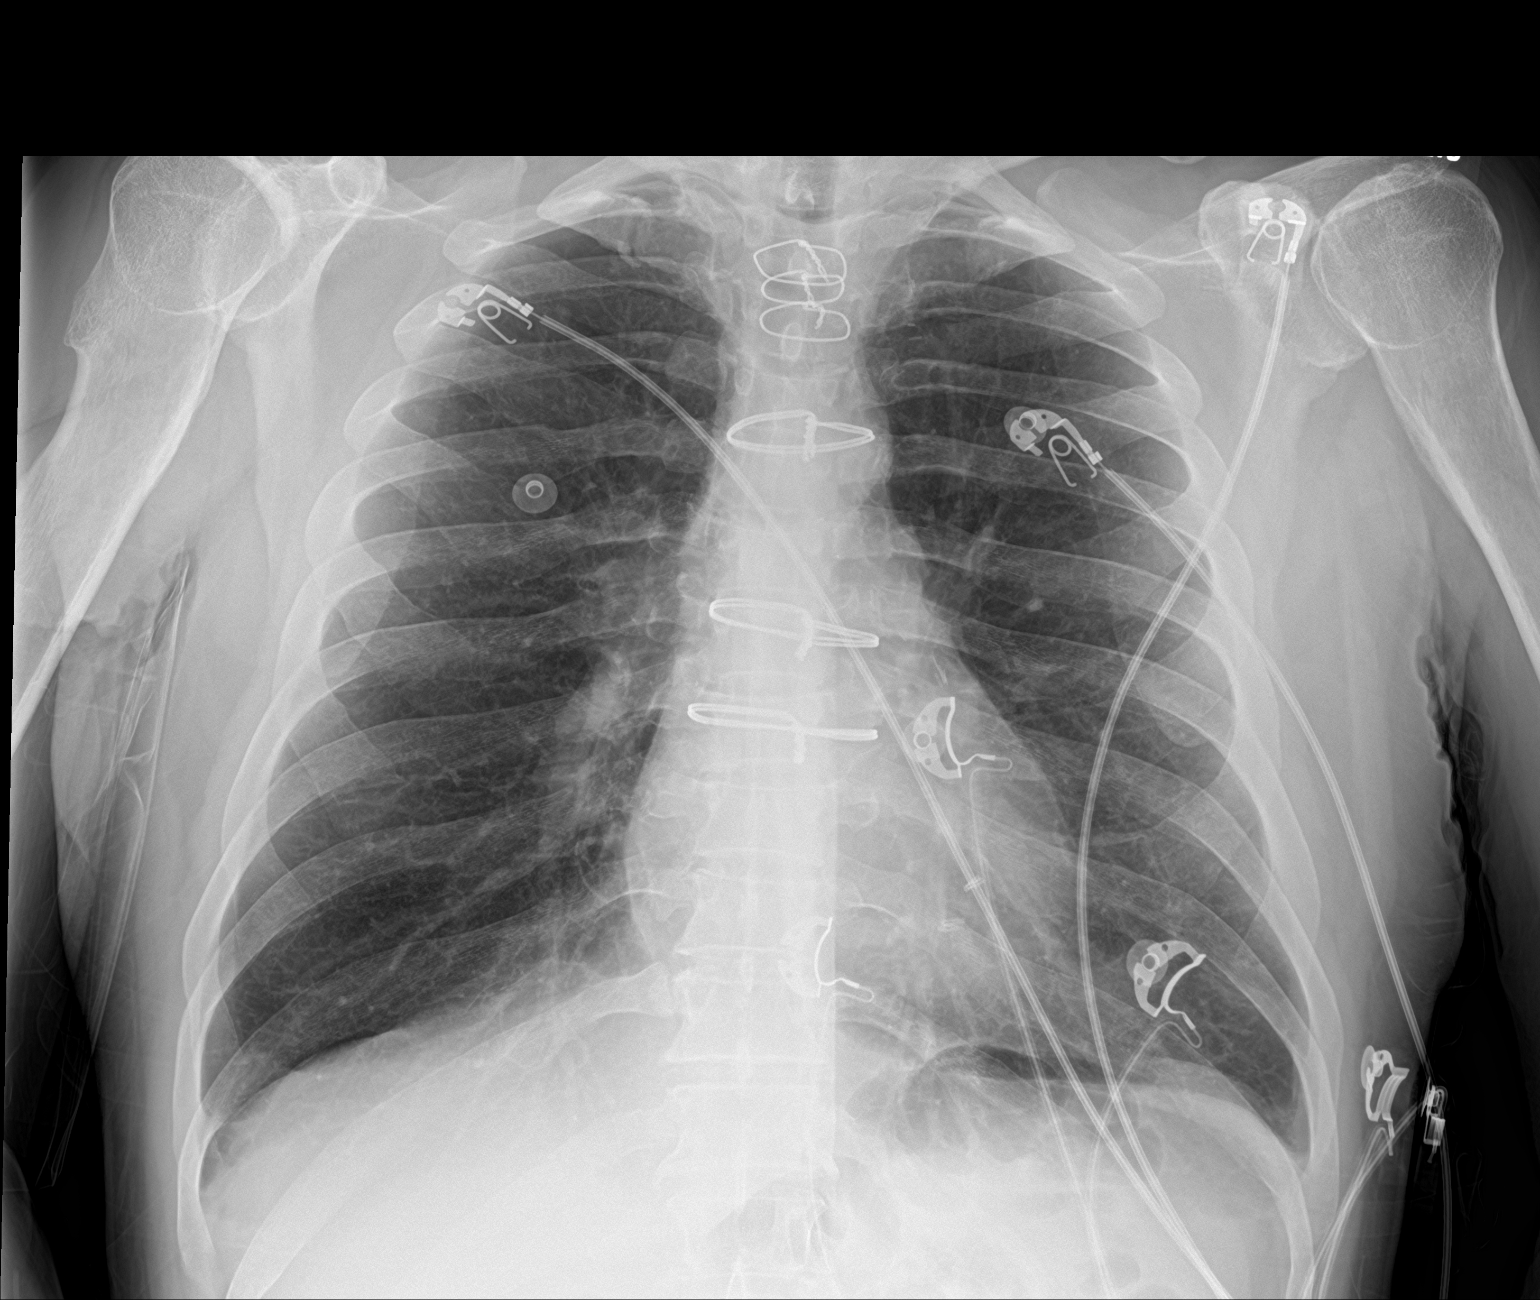

[2 of 2 positions shown; findings below may reference images not displayed]

FINDINGS: The heart size and mediastinal contours are within normal limits.
Sternotomy wires are noted. Both lungs are clear. The visualized
skeletal structures are unremarkable.
IMPRESSION: No active cardiopulmonary disease.

## 2022-09-03 DIAGNOSIS — I25118 Atherosclerotic heart disease of native coronary artery with other forms of angina pectoris: Secondary | ICD-10-CM | POA: Diagnosis not present

## 2022-09-03 DIAGNOSIS — Z0131 Encounter for examination of blood pressure with abnormal findings: Secondary | ICD-10-CM | POA: Diagnosis not present

## 2022-09-03 DIAGNOSIS — Z712 Person consulting for explanation of examination or test findings: Secondary | ICD-10-CM | POA: Diagnosis not present

## 2022-09-03 DIAGNOSIS — G3184 Mild cognitive impairment, so stated: Secondary | ICD-10-CM | POA: Diagnosis not present

## 2022-09-03 DIAGNOSIS — Z1389 Encounter for screening for other disorder: Secondary | ICD-10-CM | POA: Diagnosis not present

## 2022-09-03 DIAGNOSIS — I10 Essential (primary) hypertension: Secondary | ICD-10-CM | POA: Diagnosis not present

## 2022-10-08 DIAGNOSIS — R4689 Other symptoms and signs involving appearance and behavior: Secondary | ICD-10-CM | POA: Diagnosis not present

## 2022-10-08 DIAGNOSIS — G479 Sleep disorder, unspecified: Secondary | ICD-10-CM | POA: Diagnosis not present

## 2022-10-08 DIAGNOSIS — Z951 Presence of aortocoronary bypass graft: Secondary | ICD-10-CM | POA: Diagnosis not present

## 2022-10-08 DIAGNOSIS — R413 Other amnesia: Secondary | ICD-10-CM | POA: Diagnosis not present

## 2022-10-08 DIAGNOSIS — R4189 Other symptoms and signs involving cognitive functions and awareness: Secondary | ICD-10-CM | POA: Diagnosis not present

## 2023-02-01 ENCOUNTER — Encounter: Payer: Self-pay | Admitting: Emergency Medicine

## 2023-02-01 ENCOUNTER — Emergency Department: Payer: Medicare (Managed Care)

## 2023-02-01 ENCOUNTER — Other Ambulatory Visit: Payer: Self-pay

## 2023-02-01 ENCOUNTER — Emergency Department
Admission: EM | Admit: 2023-02-01 | Discharge: 2023-02-01 | Disposition: A | Discharge status: Home / Self Care | Payer: Medicare (Managed Care) | Attending: Emergency Medicine | Admitting: Emergency Medicine

## 2023-02-01 DIAGNOSIS — E119 Type 2 diabetes mellitus without complications: Secondary | ICD-10-CM | POA: Diagnosis not present

## 2023-02-01 DIAGNOSIS — I2510 Atherosclerotic heart disease of native coronary artery without angina pectoris: Secondary | ICD-10-CM | POA: Insufficient documentation

## 2023-02-01 DIAGNOSIS — R4182 Altered mental status, unspecified: Secondary | ICD-10-CM | POA: Insufficient documentation

## 2023-02-01 DIAGNOSIS — I959 Hypotension, unspecified: Secondary | ICD-10-CM | POA: Diagnosis not present

## 2023-02-01 DIAGNOSIS — F0390 Unspecified dementia without behavioral disturbance: Secondary | ICD-10-CM | POA: Diagnosis not present

## 2023-02-01 DIAGNOSIS — R29818 Other symptoms and signs involving the nervous system: Secondary | ICD-10-CM | POA: Diagnosis not present

## 2023-02-01 DIAGNOSIS — I10 Essential (primary) hypertension: Secondary | ICD-10-CM | POA: Diagnosis not present

## 2023-02-01 DIAGNOSIS — R001 Bradycardia, unspecified: Secondary | ICD-10-CM | POA: Diagnosis not present

## 2023-02-01 DIAGNOSIS — G319 Degenerative disease of nervous system, unspecified: Secondary | ICD-10-CM | POA: Diagnosis not present

## 2023-02-01 DIAGNOSIS — R4781 Slurred speech: Secondary | ICD-10-CM | POA: Insufficient documentation

## 2023-02-01 DIAGNOSIS — E785 Hyperlipidemia, unspecified: Secondary | ICD-10-CM | POA: Insufficient documentation

## 2023-02-01 DIAGNOSIS — Y900 Blood alcohol level of less than 20 mg/100 ml: Secondary | ICD-10-CM | POA: Diagnosis not present

## 2023-02-01 DIAGNOSIS — R519 Headache, unspecified: Secondary | ICD-10-CM | POA: Diagnosis not present

## 2023-02-01 DIAGNOSIS — R4701 Aphasia: Secondary | ICD-10-CM | POA: Diagnosis present

## 2023-02-01 LAB — DIFFERENTIAL
Abs Immature Granulocytes: 0.02 10*3/uL (ref 0.00–0.07)
Basophils Absolute: 0 10*3/uL (ref 0.0–0.1)
Basophils Relative: 1 %
Eosinophils Absolute: 0.2 10*3/uL (ref 0.0–0.5)
Eosinophils Relative: 4 %
Immature Granulocytes: 0 %
Lymphocytes Relative: 25 %
Lymphs Abs: 1.7 10*3/uL (ref 0.7–4.0)
Monocytes Absolute: 0.5 10*3/uL (ref 0.1–1.0)
Monocytes Relative: 8 %
Neutro Abs: 4.3 10*3/uL (ref 1.7–7.7)
Neutrophils Relative %: 62 %

## 2023-02-01 LAB — COMPREHENSIVE METABOLIC PANEL
ALT: 19 U/L (ref 0–44)
AST: 29 U/L (ref 15–41)
Albumin: 4.1 g/dL (ref 3.5–5.0)
Alkaline Phosphatase: 73 U/L (ref 38–126)
Anion gap: 7 (ref 5–15)
BUN: 21 mg/dL (ref 8–23)
CO2: 25 mmol/L (ref 22–32)
Calcium: 8.9 mg/dL (ref 8.9–10.3)
Chloride: 104 mmol/L (ref 98–111)
Creatinine, Ser: 0.95 mg/dL (ref 0.61–1.24)
GFR, Estimated: >60 mL/min (ref >60)
Glucose, Bld: 112 mg/dL — ABNORMAL HIGH (ref 70–99)
Potassium: 4 mmol/L (ref 3.5–5.1)
Sodium: 136 mmol/L (ref 135–145)
Total Bilirubin: 0.8 mg/dL (ref 0.3–1.2)
Total Protein: 6.7 g/dL (ref 6.5–8.1)

## 2023-02-01 LAB — CBC
HCT: 41.1 % (ref 39.0–52.0)
Hemoglobin: 14.1 g/dL (ref 13.0–17.0)
MCH: 31.1 pg (ref 26.0–34.0)
MCHC: 34.3 g/dL (ref 30.0–36.0)
MCV: 90.7 fL (ref 80.0–100.0)
Platelets: 220 10*3/uL (ref 150–400)
RBC: 4.53 MIL/uL (ref 4.22–5.81)
RDW: 13.2 % (ref 11.5–15.5)
WBC: 6.8 10*3/uL (ref 4.0–10.5)
nRBC: 0 % (ref 0.0–0.2)

## 2023-02-01 LAB — ETHANOL: Alcohol, Ethyl (B): <10 mg/dL (ref <10)

## 2023-02-01 LAB — PROTIME-INR
INR: 1.1 (ref 0.8–1.2)
Prothrombin Time: 14.1 seconds (ref 11.4–15.2)

## 2023-02-01 LAB — APTT: aPTT: 28 seconds (ref 24–36)

## 2023-02-01 NOTE — ED Provider Notes (Signed)
[facility] Provider Note    Event Date/Time   First MD Initiated Contact with Patient 02/01/23 1451     (approximate)   History   Aphasia   HPI  Jason Burke is a 67 y.o. male with a history of diabetes, coronary artery disease, hypertension, hyperlipidemia who presents with reports of slurred speech.  Son reports that he spoke to them this morning and felt that his speech was quite slurred and that is atypical for him.  Son last spoke to him yesterday.  Son reports that he did complain of a headache to him and took the medicine for it at home.  Son notes that patient's wife is currently in the ICU is not doing well and that he also suspects undiagnosed dementia in his father.  He reports he has not been sleeping well     Physical Exam   Triage Vital Signs: ED Triage Vitals [02/01/23 1404]  Enc Vitals Group     BP 131/68     Pulse Rate 68     Resp 18     Temp 97.7 F (36.5 C)     Temp Source Oral     SpO2 93 %     Weight      Height      Head Circumference      Peak Flow      Pain Score 0     Pain Loc      Pain Edu?      Excl. in GC?     Most recent vital signs: Vitals:   02/01/23 1404 02/01/23 1820  BP: 131/68 (!) 152/86  Pulse: 68 62  Resp: 18 18  Temp: 97.7 F (36.5 C)   SpO2: 93% 96%     General: Awake, no distress.  CV:  Good peripheral perfusion.  Resp:  Normal effort.  Abd:  No distention.  Other:  Neuroexam: Patient is difficult to understand with some slurred speech however cranial nerves II to XII appear normal, normal strength in all extremities.  He reports he feels well and feels that his speech is normal, son disagrees.   ED Results / Procedures / Treatments   Labs (all labs ordered are listed, but only abnormal results are displayed) Labs Reviewed  COMPREHENSIVE METABOLIC PANEL - Abnormal; Notable for the following components:      Result Value   Glucose, Bld 112 (*)    All other components within normal  limits  PROTIME-INR  APTT  CBC  DIFFERENTIAL  ETHANOL  URINALYSIS, ROUTINE W REFLEX MICROSCOPIC  URINE DRUG SCREEN, QUALITATIVE ([facility] ONLY)  CBG MONITORING, ED     EKG  ED ECG REPORT I, Jene Every, the attending physician, personally viewed and interpreted this ECG.  Date: 02/01/2023  Rhythm: normal sinus rhythm QRS Axis: normal Intervals: normal ST/T Wave abnormalities: normal Narrative Interpretation: no evidence of acute ischemia    RADIOLOGY CT head interval by me, no acute normality, pending radiology read    PROCEDURES:  Critical Care performed:   Procedures   MEDICATIONS ORDERED IN ED: Medications - No data to display   IMPRESSION / MDM / ASSESSMENT AND PLAN / ED COURSE  I reviewed the triage vital signs and the nursing notes. Patient's presentation is most consistent with acute presentation with potential threat to life or bodily function.  Patient presents with slurred speech as described above.  Differential includes CVA, lack of sleep, infection, dementia  Lab work thus far is reassuring, pending  CT head, urinalysis  ----------------------------------------- 3:48 PM on 02/01/2023 ----------------------------------------- CT scan is unremarkable, will proceed with MRI  MRI negative for acute stroke, family is reassured, appropriate for discharge at this time     FINAL CLINICAL IMPRESSION(S) / ED DIAGNOSES   Final diagnoses:  Altered mental status, unspecified altered mental status type     Rx / DC Orders   ED Discharge Orders     None        Note:  This document was prepared using Dragon voice recognition software and may include unintentional dictation errors.   Jene Every, MD 02/01/23 [phone]

## 2023-02-01 NOTE — ED Triage Notes (Signed)
Patient to ED via [facility] from home for possible slurred speech. Patient's kids called EMS after speaking on the phone with him and thought it was slurred. Patient states his speech is normal. FD that was on scene states his speech is normal (they run calls on wife often per EMS). Patient has no complaints at this time.  [phone]- son, Chidiebere Roa- pt that called

## 2023-02-13 DIAGNOSIS — F411 Generalized anxiety disorder: Secondary | ICD-10-CM | POA: Diagnosis not present

## 2023-02-19 ENCOUNTER — Emergency Department: Payer: Medicare (Managed Care)

## 2023-02-19 DIAGNOSIS — Z7982 Long term (current) use of aspirin: Secondary | ICD-10-CM | POA: Diagnosis not present

## 2023-02-19 DIAGNOSIS — Y900 Blood alcohol level of less than 20 mg/100 ml: Secondary | ICD-10-CM | POA: Insufficient documentation

## 2023-02-19 DIAGNOSIS — F17210 Nicotine dependence, cigarettes, uncomplicated: Secondary | ICD-10-CM | POA: Insufficient documentation

## 2023-02-19 DIAGNOSIS — R413 Other amnesia: Secondary | ICD-10-CM | POA: Insufficient documentation

## 2023-02-19 DIAGNOSIS — F17200 Nicotine dependence, unspecified, uncomplicated: Secondary | ICD-10-CM | POA: Insufficient documentation

## 2023-02-19 DIAGNOSIS — I2510 Atherosclerotic heart disease of native coronary artery without angina pectoris: Secondary | ICD-10-CM | POA: Insufficient documentation

## 2023-02-19 DIAGNOSIS — Z79899 Other long term (current) drug therapy: Secondary | ICD-10-CM | POA: Insufficient documentation

## 2023-02-19 DIAGNOSIS — F4321 Adjustment disorder with depressed mood: Secondary | ICD-10-CM

## 2023-02-19 DIAGNOSIS — Z951 Presence of aortocoronary bypass graft: Secondary | ICD-10-CM | POA: Diagnosis not present

## 2023-02-19 DIAGNOSIS — F29 Unspecified psychosis not due to a substance or known physiological condition: Secondary | ICD-10-CM | POA: Insufficient documentation

## 2023-02-19 DIAGNOSIS — Z9183 Wandering in diseases classified elsewhere: Secondary | ICD-10-CM | POA: Diagnosis not present

## 2023-02-19 DIAGNOSIS — I10 Essential (primary) hypertension: Secondary | ICD-10-CM | POA: Insufficient documentation

## 2023-02-19 DIAGNOSIS — Z955 Presence of coronary angioplasty implant and graft: Secondary | ICD-10-CM | POA: Insufficient documentation

## 2023-02-19 DIAGNOSIS — E119 Type 2 diabetes mellitus without complications: Secondary | ICD-10-CM | POA: Insufficient documentation

## 2023-02-19 DIAGNOSIS — R4182 Altered mental status, unspecified: Secondary | ICD-10-CM | POA: Diagnosis not present

## 2023-02-19 DIAGNOSIS — Z7984 Long term (current) use of oral hypoglycemic drugs: Secondary | ICD-10-CM | POA: Diagnosis not present

## 2023-02-19 DIAGNOSIS — R45851 Suicidal ideations: Secondary | ICD-10-CM | POA: Diagnosis not present

## 2023-02-19 DIAGNOSIS — F4381 Prolonged grief disorder: Secondary | ICD-10-CM | POA: Insufficient documentation

## 2023-02-19 LAB — CBC WITH DIFFERENTIAL/PLATELET
Abs Immature Granulocytes: 0.02 10*3/uL (ref 0.00–0.07)
Basophils Absolute: 0.1 10*3/uL (ref 0.0–0.1)
Basophils Relative: 1 %
Eosinophils Absolute: 0.3 10*3/uL (ref 0.0–0.5)
Eosinophils Relative: 5 %
HCT: 45.5 % (ref 39.0–52.0)
Hemoglobin: 15.2 g/dL (ref 13.0–17.0)
Immature Granulocytes: 0 %
Lymphocytes Relative: 29 %
Lymphs Abs: 1.8 10*3/uL (ref 0.7–4.0)
MCH: 30.5 pg (ref 26.0–34.0)
MCHC: 33.4 g/dL (ref 30.0–36.0)
MCV: 91.4 fL (ref 80.0–100.0)
Monocytes Absolute: 0.5 10*3/uL (ref 0.1–1.0)
Monocytes Relative: 8 %
Neutro Abs: 3.6 10*3/uL (ref 1.7–7.7)
Neutrophils Relative %: 57 %
Platelets: 242 10*3/uL (ref 150–400)
RBC: 4.98 MIL/uL (ref 4.22–5.81)
RDW: 12.9 % (ref 11.5–15.5)
WBC: 6.2 10*3/uL (ref 4.0–10.5)
nRBC: 0 % (ref 0.0–0.2)

## 2023-02-19 LAB — ACETAMINOPHEN LEVEL: Acetaminophen (Tylenol), Serum: <10 ug/mL — ABNORMAL LOW (ref 10–30)

## 2023-02-19 LAB — ETHANOL: Alcohol, Ethyl (B): <10 mg/dL (ref <10)

## 2023-02-19 LAB — SALICYLATE LEVEL: Salicylate Lvl: <7 mg/dL — ABNORMAL LOW (ref 7.0–30.0)

## 2023-02-19 NOTE — ED Triage Notes (Signed)
Pt arrived by [facility] with IVC paperwork, pt's wife has passed 2 weeks ago and since then pt has had some bizarre behavior, confusion, SI thoughts/threats, not taking meds, and getting lost driving randomly. Pt's son concern for pt's well being and took out IVC paperwork this afternoon. Pt denies SI/HI at this time, pt is calm and cooperative.

## 2023-02-19 NOTE — ED Notes (Signed)
2 red and black shoes 1 blue jeans 1 brown belt  2 black socks 1 black underwear  1 blue shirt  1 lighter / 1 wallet in plastic container  $5.03 in change also in plastic container per pt request.

## 2023-02-19 NOTE — ED Provider Notes (Addendum)
[facility] Provider Note    Event Date/Time   First MD Initiated Contact with Patient 02/19/23 2311     (approximate)   History   IVC  Mental Eval   HPI  Jason Burke is a 67 y.o. male brought to the ED via [facility] under IVC for bizarre behavior, confusion, suicidal thoughts.  Patient's wife passed away 2 weeks ago and since then patient has reportedly not been taking his medications, voicing suicidal thoughts without plan and acting bizarre.  Son was concerned for his safety so took out IVC papers.  Patient denies active SI/HI/AH/VH.  Tells me he does not take prescription medicines on a daily basis normally.  Has no medical complaints.     Past Medical History   Past Medical History:  Diagnosis Date   Bicuspid aortic valve    a. 11/2009 Echo: EF 55%, nl RV fxn, bicuspid AoV; b. 07/2022 Echo: EF 55-60%, no rwma, nl RV fxn, AoV sclerosis w/o stenosis. Indeterminant # of cusps.   Coronary artery disease    a. s/p prior LAD stenting; b. 2008 CABGx1: LIMA->LAD; c. 11/2009 PCI ([facility]): LM 20ost, LAD 40p, 26m, LCX 30p, RI 40 diffuse, RCA 35m (2.75x23 Xience DES); d. 07/2022 Cath: LM nl, LAD 10p ISR, 13m, LCX small/nl, RCA 10ost/p, 15p, patent mid stent, 65d, LIMA->LAD atretic, 100 @ insertion-->med rx.   Diabetes mellitus without complication (HCC)    Hyperlipidemia    Hypertension    MI (myocardial infarction) (HCC) 09/08/2006     Active Problem List   Patient Active Problem List   Diagnosis Date Noted   Hx of CABG 07/25/2022   Coronary artery disease of native artery of native heart with stable angina pectoris (HCC) 07/24/2022   Unstable angina (HCC) 07/24/2022   CAD (coronary artery disease) 07/24/2022   Diarrhea 07/24/2022   Accelerating angina (HCC) 01/24/2022   Bicuspid aortic valve 01/24/2022   Cervical strain, subsequent encounter 11/28/2014   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   Mixed hyperlipidemia 11/02/2013    Bronchitis 11/02/2013   Tobacco abuse 11/02/2013   Essential hypertension 11/02/2013   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     Past Surgical History   Past Surgical History:  Procedure Laterality Date   CARDIAC CATHETERIZATION     CORONARY ANGIOPLASTY  2007   stent x 2   CORONARY ARTERY BYPASS GRAFT  2008   x 3   LEFT HEART CATH AND CORS/GRAFTS ANGIOGRAPHY Left 07/24/2022   Procedure: LEFT HEART CATH AND CORS/GRAFTS ANGIOGRAPHY;  Surgeon: Marykay Lex, MD;  Location: [facility] INVASIVE CV LAB;  Service: Cardiovascular;  Laterality: Left;   RENAL ARTERY STENT  2007   Left kidney     Home Medications   Prior to Admission medications   Medication Sig Start Date End Date Taking? Authorizing Provider  isosorbide mononitrate (IMDUR) 60 MG 24 hr tablet Take 60 mg by mouth daily. 01/05/23  Yes [provider]  QUEtiapine (SEROQUEL) 25 MG tablet Take 25 mg by mouth at bedtime. 10/08/22 10/03/23 Yes [provider]  aspirin EC 81 MG tablet Take 1 tablet (81 mg total) by mouth daily. Swallow whole. 01/23/22   End, Cristal Deer, MD  atorvastatin (LIPITOR) 80 MG tablet Take 80 mg by mouth daily.    [provider]  cetirizine (ZYRTEC) 10 MG tablet Take 10 mg by mouth daily.    [provider]  ezetimibe (ZETIA) 10 MG tablet Take 1 tablet (  10 mg total) by mouth daily. 07/26/22   Enedina Finner, MD  FLUoxetine (PROZAC) 10 MG capsule Take 10 mg by mouth daily.    [provider]  fluticasone-salmeterol (ADVAIR) 250-50 MCG/ACT AEPB Inhale 1 puff into the lungs 2 (two) times daily as needed.    [provider]  isosorbide mononitrate (IMDUR) 30 MG 24 hr tablet Take 1 tablet (30 mg total) by mouth daily. 07/25/22   Enedina Finner, MD  losartan (COZAAR) 25 MG tablet Take 25 mg by mouth daily. 12/12/20   [provider]  metFORMIN (GLUMETZA) 500 MG (MOD) 24 hr tablet Take 500 mg by mouth daily with breakfast.    [provider]  nitroGLYCERIN (NITROSTAT) 0.4 MG SL tablet Place 0.4 mg under the tongue every 5 (five) minutes as needed for chest pain.    [provider]  VENTOLIN HFA 108 (90 Base) MCG/ACT inhaler Inhale      2 puffs every 4 hours as needed for wheeze, cough, or SOB. (PLEASE [employer] INSURANCE PREFERRED INHALER) 12/13/20   [provider]  Vitamin D, Ergocalciferol, (DRISDOL) 1.25 MG (50000 UNIT) CAPS capsule Take 50,000 Units by mouth every 7 (seven) days. Monday    [provider]     Allergies  Methocarbamol   Family History   Family History  Problem Relation Age of Onset   Heart disease Mother 72       < age 67   Heart disease Father 45   Alcoholism Father      Physical Exam  Triage Vital Signs: ED Triage Vitals [02/19/23 2254]  Enc Vitals Group     BP      Pulse      Resp      Temp      Temp src      SpO2      Weight 125 lb (56.7 kg)     Height 4\' 10"  (1.473 m)     Head Circumference      Peak Flow      Pain Score 0     Pain Loc      Pain Edu?      Excl. in GC?     Updated Vital Signs: BP (!) 176/75   Pulse 67   Temp 98.2 F (36.8 C)   Resp 18   Ht 4\' 10"  (1.473 m)   Wt 56.7 kg   SpO2 97%   BMI 26.13 kg/m    General: Awake, no distress.   CV:  RRR.  Good peripheral perfusion.  Resp:  Normal effort.  CTAB. Abd:  Nontender.  No distention.  Other:  Disheveled.  Alert & oriented x 3.  CN II-XII grossly intact.  5/5 motor strength and sensation all extremities.  MAE x 4.   ED Results / Procedures / Treatments  Labs (all labs ordered are listed, but only abnormal results are displayed) Labs Reviewed  COMPREHENSIVE METABOLIC PANEL - Abnormal; Notable for the following components:      Result Value   Glucose, Bld 214 (*)    Calcium 8.8 (*)    All other components within normal limits  ACETAMINOPHEN LEVEL - Abnormal; Notable for the following components:   Acetaminophen (Tylenol), Serum <10 (*)    All other  components within normal limits  SALICYLATE LEVEL - Abnormal; Notable for the following components:   Salicylate Lvl <7.0 (*)    All other components within normal limits  ETHANOL  URINE DRUG SCREEN, QUALITATIVE ([facility] ONLY)  CBC WITH DIFFERENTIAL/PLATELET  TROPONIN I (HIGH SENSITIVITY)     EKG  None   RADIOLOGY I have independently visualized interpreted patient's CT scan as well as noted the radiology interpretation:  CT head: No ICH  Official radiology report(s): CT Head Wo Contrast  Result Date: 02/19/2023 CLINICAL DATA:  Mental status change, unknown cause EXAM: CT HEAD WITHOUT CONTRAST TECHNIQUE: Contiguous axial images were obtained from the base of the skull through the vertex without intravenous contrast. RADIATION DOSE REDUCTION: This exam was performed according to the departmental dose-optimization program which includes automated exposure control, adjustment of the mA and/or kV according to patient size and/or use of iterative reconstruction technique. COMPARISON:  Head CT and brain MRI 02/01/2023 FINDINGS: Brain: No intracranial hemorrhage, mass effect, or midline shift. No hydrocephalus. The basilar cisterns are patent. Stable periventricular chronic small vessel ischemic change. No evidence of territorial infarct or acute ischemia. No extra-axial or intracranial fluid collection. Vascular: No hyperdense vessel or unexpected calcification. Skull: No fracture or focal lesion. Sinuses/Orbits: No acute finding. Other: None. IMPRESSION: 1. No acute intracranial abnormality. 2. Stable chronic small vessel ischemic change. Electronically Signed   By: Narda Rutherford M.D.   On: 02/19/2023 23:33     PROCEDURES:  Critical Care performed: No  Procedures   MEDICATIONS ORDERED IN ED: Medications - No data to display   IMPRESSION / MDM / ASSESSMENT AND PLAN / ED COURSE  I reviewed the triage vital signs and the nursing notes.                             67 year old male  presenting to the ED under IVC for psychosis/grief reaction.  Will check basic lab work, troponin, EKG, CT head.  Patient's presentation is most consistent with acute illness / injury with system symptoms.  The patient has been placed in psychiatric observation due to the need to provide a safe environment for the patient while obtaining psychiatric consultation and evaluation, as well as ongoing medical and medication management to treat the patient's condition.  The patient has been placed under full IVC at this time.   Clinical Course as of 02/20/23 0542  Fri Feb 20, 2023  1610 Laboratory and CT results unremarkable other than mild hyperglycemia without elevation of anion gap.  Patient is medically cleared at this time for psychiatric evaluation and disposition. [JS]  0541 Remains in the emergency department under IVC pending psychiatric evaluation. [JS]    Clinical Course User Index [JS] Irean Hong, MD     FINAL CLINICAL IMPRESSION(S) / ED DIAGNOSES   Final diagnoses:  Psychosis, unspecified psychosis type (HCC)  Grief reaction     Rx / DC Orders   ED Discharge Orders     None        Note:  This document was prepared using Dragon voice recognition software and may include unintentional dictation errors.   Irean Hong, MD 02/20/23 9604    Irean Hong, MD 02/20/23 [phone]

## 2023-02-20 ENCOUNTER — Emergency Department
Admission: EM | Admit: 2023-02-20 | Discharge: 2023-02-25 | Disposition: A | Discharge status: Skilled Nursing Facility | Payer: Medicare (Managed Care) | Attending: Emergency Medicine | Admitting: Emergency Medicine

## 2023-02-20 DIAGNOSIS — R45851 Suicidal ideations: Secondary | ICD-10-CM

## 2023-02-20 DIAGNOSIS — F4321 Adjustment disorder with depressed mood: Secondary | ICD-10-CM

## 2023-02-20 DIAGNOSIS — Z9183 Wandering in diseases classified elsewhere: Secondary | ICD-10-CM | POA: Diagnosis not present

## 2023-02-20 DIAGNOSIS — R413 Other amnesia: Secondary | ICD-10-CM

## 2023-02-20 DIAGNOSIS — F29 Unspecified psychosis not due to a substance or known physiological condition: Secondary | ICD-10-CM

## 2023-02-20 DIAGNOSIS — Z7689 Persons encountering health services in other specified circumstances: Secondary | ICD-10-CM

## 2023-02-20 LAB — COMPREHENSIVE METABOLIC PANEL
ALT: 17 U/L (ref 0–44)
AST: 21 U/L (ref 15–41)
Albumin: 3.9 g/dL (ref 3.5–5.0)
Alkaline Phosphatase: 81 U/L (ref 38–126)
Anion gap: 8 (ref 5–15)
BUN: 17 mg/dL (ref 8–23)
CO2: 25 mmol/L (ref 22–32)
Calcium: 8.8 mg/dL — ABNORMAL LOW (ref 8.9–10.3)
Chloride: 103 mmol/L (ref 98–111)
Creatinine, Ser: 0.78 mg/dL (ref 0.61–1.24)
GFR, Estimated: >60 mL/min (ref >60)
Glucose, Bld: 214 mg/dL — ABNORMAL HIGH (ref 70–99)
Potassium: 3.5 mmol/L (ref 3.5–5.1)
Sodium: 136 mmol/L (ref 135–145)
Total Bilirubin: 0.5 mg/dL (ref 0.3–1.2)
Total Protein: 6.7 g/dL (ref 6.5–8.1)

## 2023-02-20 LAB — URINE DRUG SCREEN, QUALITATIVE (ARMC ONLY)
Amphetamines, Ur Screen: NOT DETECTED
Barbiturates, Ur Screen: NOT DETECTED
Benzodiazepine, Ur Scrn: NOT DETECTED
Cannabinoid 50 Ng, Ur ~~LOC~~: NOT DETECTED
Cocaine Metabolite,Ur ~~LOC~~: NOT DETECTED
MDMA (Ecstasy)Ur Screen: NOT DETECTED
Methadone Scn, Ur: NOT DETECTED
Opiate, Ur Screen: NOT DETECTED
Phencyclidine (PCP) Ur S: NOT DETECTED
Tricyclic, Ur Screen: NOT DETECTED

## 2023-02-20 LAB — TROPONIN I (HIGH SENSITIVITY): Troponin I (High Sensitivity): 7 ng/L (ref <18)

## 2023-02-20 MED ORDER — QUETIAPINE FUMARATE 25 MG PO TABS
25.0000 mg | ORAL_TABLET | Freq: Every day | ORAL | Status: DC
  Administered 2023-02-20 – 2023-02-21 (×2): 25 mg via ORAL
  Filled 2023-02-20 (×2): qty 1

## 2023-02-20 MED ORDER — EZETIMIBE 10 MG PO TABS
10.0000 mg | ORAL_TABLET | Freq: Every day | ORAL | Status: DC
  Administered 2023-02-20 – 2023-02-25 (×6): 10 mg via ORAL
  Filled 2023-02-20 (×6): qty 1

## 2023-02-20 MED ORDER — ASPIRIN 81 MG PO TBEC
81.0000 mg | DELAYED_RELEASE_TABLET | Freq: Every day | ORAL | Status: DC
  Administered 2023-02-20 – 2023-02-25 (×6): 81 mg via ORAL
  Filled 2023-02-20 (×6): qty 1

## 2023-02-20 MED ORDER — FLUOXETINE HCL 10 MG PO CAPS
10.0000 mg | ORAL_CAPSULE | Freq: Every day | ORAL | Status: DC
  Administered 2023-02-20 – 2023-02-25 (×6): 10 mg via ORAL
  Filled 2023-02-20 (×6): qty 1

## 2023-02-20 MED ORDER — ISOSORBIDE MONONITRATE ER 60 MG PO TB24
60.0000 mg | ORAL_TABLET | Freq: Every day | ORAL | Status: DC
  Administered 2023-02-20 – 2023-02-24 (×5): 60 mg via ORAL
  Filled 2023-02-20 (×5): qty 1

## 2023-02-20 NOTE — ED Notes (Signed)
This NT provided pt with pm snack. 

## 2023-02-20 NOTE — BH Assessment (Signed)
Comprehensive Clinical Assessment (CCA) Note  02/20/2023 JANSSEN PULS [id number]  Chief Complaint: Patient is a 67 year old male presenting to [facility] ED  under IVC. Per triage note Pt arrived by [facility] with IVC paperwork, pt's wife has passed 2 weeks ago and since then pt has had some bizarre behavior, confusion, SI thoughts/threats, not taking meds, and getting lost driving randomly. Pt's son concern for pt's well being and took out IVC paperwork this afternoon. Pt denies SI/HI at this time, pt is calm and cooperative. During assessment patient appears alert and oriented x1, calm and cooperative, his thoughts are disorganized and his speech is slurred. Patient is unaware of where he is, who he lives with or who the president is. When asked why patient is presenting to the ED he reports "I want to be with my momma, I want to live with her, they just put me here, I don't know why I'm here." When asked about who he lives with the patient cannot recall and reports "get it over with, I know what it is it's those kids." When asked what year it is the patient reports "43." When asked about SI, he denies and he denies making any past comments to hurt himself. Patient also denies HI/AH/VH.  Attempted to obtain collateral from patient's son Donnald Millirons at [phone] but no answer, a HIPAA complaint voicemail was left to return phone call. Chief Complaint  Patient presents with   IVC  Mental Eval   Visit Diagnosis: Altered Mental Status    CCA Screening, Triage and Referral (STR)  Patient Reported Information How did you hear about [place]? Legal System  Referral name: No data recorded Referral phone number: No data recorded  Whom do you see for routine medical problems? No data recorded Practice/Facility Name: No data recorded Practice/Facility Phone Number: No data recorded Name of Contact: No data recorded Contact Number: No data recorded Contact Fax Number: No data recorded Prescriber Name: No  data recorded Prescriber Address (if known): No data recorded  What Is the Reason for Your Visit/Call Today? Pt arrived by [facility] with IVC paperwork, pt's wife has passed 2 weeks ago and since then pt has had some bizarre behavior, confusion, SI thoughts/threats, not taking meds, and getting lost driving randomly. Pt's son concern for pt's well being and took out IVC paperwork this afternoon. Pt denies SI/HI at this time, pt is calm and cooperative.  How Long Has This Been Causing You Problems? > than 6 months  What Do You Feel Would Help You the Most Today? No data recorded  Have You Recently Been in Any Inpatient Treatment (Hospital/Detox/Crisis Center/28-Day Program)? No data recorded Name/Location of Program/Hospital:No data recorded How Long Were You There? No data recorded When Were You Discharged? No data recorded  Have You Ever Received Services From [facility] Before? No data recorded Who Do You See at [facility]? No data recorded  Have You Recently Had Any Thoughts About Hurting Yourself? No  Are You Planning to Commit Suicide/Harm Yourself At This time? No   Have you Recently Had Thoughts About Hurting Someone Karolee Ohs? No  Explanation: No data recorded  Have You Used Any Alcohol or Drugs in the Past 24 Hours? No  How Long Ago Did You Use Drugs or Alcohol? No data recorded What Did You Use and How Much? No data recorded  Do You Currently Have a Therapist/Psychiatrist? -- (Unknown)  Name of Therapist/Psychiatrist: No data recorded  Have You Been Recently  Discharged From Any Office Practice or Programs? No  Explanation of Discharge From Practice/Program: No data recorded    CCA Screening Triage Referral Assessment Type of Contact: Face-to-Face  Is this Initial or Reassessment? No data recorded Date Telepsych consult ordered in [facility]:  No data recorded Time Telepsych consult ordered in [facility]:  No data recorded  Patient Reported Information Reviewed? No data  recorded Patient Left Without Being Seen? No data recorded Reason for Not Completing Assessment: No data recorded  Collateral Involvement: No data recorded  Does Patient Have a Court Appointed Legal Guardian? No data recorded Name and Contact of Legal Guardian: No data recorded If Minor and Not Living with Parent(s), Who has Custody? No data recorded Is CPS involved or ever been involved? Never  Is APS involved or ever been involved? Never   Patient Determined To Be At Risk for Harm To Self or Others Based on Review of Patient Reported Information or Presenting Complaint? No data recorded Method: No data recorded Availability of Means: No data recorded Intent: No data recorded Notification Required: No data recorded Additional Information for Danger to Others Potential: No data recorded Additional Comments for Danger to Others Potential: No data recorded Are There Guns or Other Weapons in Your Home? No  Types of Guns/Weapons: No data recorded Are These Weapons Safely Secured?                            No data recorded Who Could Verify You Are Able To Have These Secured: No data recorded Do You Have any Outstanding Charges, Pending Court Dates, Parole/Probation? No data recorded Contacted To Inform of Risk of Harm To Self or Others: No data recorded  Location of Assessment: [facility]   Does Patient Present under Involuntary Commitment? Yes  IVC Papers Initial File Date: No data recorded  [place] of Residence: [place]   Patient Currently Receiving the Following Services: No data recorded  Determination of Need: Emergent (2 hours)   Options For Referral: No data recorded    CCA Biopsychosocial Intake/Chief Complaint:  No data recorded Current Symptoms/Problems: No data recorded  Patient Reported Schizophrenia/Schizoaffective Diagnosis in Past: No   Strengths: Patient is able to communicate and has the support of his son  Preferences: No data recorded Abilities:  No data recorded  Type of Services Patient Feels are Needed: No data recorded  Initial Clinical Notes/Concerns: No data recorded  Mental Health Symptoms Depression:   None   Duration of Depressive symptoms: No data recorded  Mania:   None   Anxiety:    None   Psychosis:   Grossly disorganized speech   Duration of Psychotic symptoms: No data recorded  Trauma:   None   Obsessions:   None   Compulsions:   None   Inattention:   None   Hyperactivity/Impulsivity:   None   Oppositional/Defiant Behaviors:   None   Emotional Irregularity:   None   Other Mood/Personality Symptoms:  No data recorded   Mental Status Exam Appearance and self-care  Stature:   Average   Weight:   Average weight   Clothing:   Casual   Grooming:   Normal   Cosmetic use:   None   Posture/gait:   Normal   Motor activity:   Not Remarkable   Sensorium  Attention:   Confused   Concentration:   Scattered   Orientation:   Person   Recall/memory:   Defective in Short-term; Defective  in Recent   Affect and Mood  Affect:   Flat   Mood:   Other (Comment)   Relating  Eye contact:   Normal   Facial expression:   Responsive   Attitude toward examiner:   Cooperative   Thought and Language  Speech flow:  Flight of Ideas; Garbled; Pressured   Thought content:   Delusions   Preoccupation:   Ruminations   Hallucinations:   None   Organization:  No data recorded  [employer] of Knowledge:   Fair   Intelligence:   Average   Abstraction:   Functional   Judgement:   Impaired   Reality Testing:   Distorted   Insight:   Lacking; Unaware   Decision Making:   Confused   Social Functioning  Social Maturity:   Responsible   Social Judgement:   Normal   Stress  Stressors:   Illness; Grief/losses   Coping Ability:   Deficient supports   Skill Deficits:   Scientist, physiological; Communication   Supports:   Family      Religion: Religion/Spirituality Are You A Religious Person?:  Industrial/product designer)  Leisure/Recreation: Leisure / Recreation Do You Have Hobbies?:  (UTA)  Exercise/Diet: Exercise/Diet Do You Exercise?:  (UTA) Have You Gained or Lost A Significant Amount of Weight in the Past Six Months?:  (UTA) Do You Follow a Special Diet?:  (UTA) Do You Have Any Trouble Sleeping?: No   CCA Employment/Education Employment/Work Situation: Employment / Work 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?:  Industrial/product designer)  Education: Education Is Patient Currently Attending School?:  (UTA) Did You Have An Individualized Education Program (IIEP):  (UTA) Did You Have Any Difficulty At School?:  (UTA) Patient's Education Has Been Impacted by Current Illness:  (UTA)   CCA Family/Childhood History Family and Relationship History: Family history Marital status: Widowed Widowed, when?: Patient's wife passed away 2 weeks ago Does patient have children?: Yes How many children?: 1 How is patient's relationship with their children?: Unknown how his relationship is with his son  Childhood History:  Childhood History Did patient suffer any verbal/emotional/physical/sexual abuse as a child?:  (UTA) Did patient suffer from severe childhood neglect?:  (UTA) Has patient ever been sexually abused/assaulted/raped as an adolescent or adult?:  (UTA) Was the patient ever a victim of a crime or a disaster?:  (UTA) Witnessed domestic violence?:  (UTA) Has patient been affected by domestic violence as an adult?:  Industrial/product designer)  Child/Adolescent Assessment:     CCA Substance Use Alcohol/Drug Use: Alcohol / Drug Use Pain Medications: see mar Prescriptions: see mar Over the Counter: see mar History of alcohol / drug use?:  (unknown)                         ASAM's:  Six Dimensions of Multidimensional Assessment  Dimension 1:   Acute Intoxication and/or Withdrawal Potential:      Dimension 2:  Biomedical Conditions and Complications:      Dimension 3:  Emotional, Behavioral, or Cognitive Conditions and Complications:     Dimension 4:  Readiness to Change:     Dimension 5:  Relapse, Continued use, or Continued Problem Potential:     Dimension 6:  Recovery/Living Environment:     ASAM Severity Score:    ASAM Recommended Level of Treatment:     Substance use Disorder (SUD)    Recommendations for Services/Supports/Treatments:  DSM5 Diagnoses: Patient Active Problem List   Diagnosis Date Noted   Hx of CABG 07/25/2022   Coronary artery disease of native artery of native heart with stable angina pectoris (HCC) 07/24/2022   Unstable angina (HCC) 07/24/2022   CAD (coronary artery disease) 07/24/2022   Diarrhea 07/24/2022   Accelerating angina (HCC) 01/24/2022   Bicuspid aortic valve 01/24/2022   Cervical strain, subsequent encounter 11/28/2014   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   Mixed hyperlipidemia 11/02/2013   Bronchitis 11/02/2013   Tobacco abuse 11/02/2013   Essential hypertension 11/02/2013   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    Patient Centered Plan: Patient is on the following Treatment Plan(s):  Impulse Control   Referrals to Alternative Service(s): Referred to Alternative Service(s):   Place:   Date:   Time:    Referred to Alternative Service(s):   Place:   Date:   Time:    Referred to Alternative Service(s):   Place:   Date:   Time:    Referred to Alternative Service(s):   Place:   Date:   Time:      @BHCOLLABOFCARE @  [employer], LCAS-A

## 2023-02-20 NOTE — Progress Notes (Signed)
Chap responded to a page from unit. Pt requested support in completing the Adv. Dir. This Mirna Mires supplied the forms and education to the Pt how to fill them. Kindly page the [place] as need arise.   02/20/23 1800  Spiritual Encounters  Type of Visit Initial  Care provided to: Pt and family  Conversation partners present during encounter Nurse  Referral source Patient request  Reason for visit Advance directives  OnCall Visit Yes  Spiritual Framework  Presenting Themes Goals in life/care;Courage hope and growth  Community/Connection Family  Interventions  Spiritual Care Interventions Made Established relationship of care and support;Decision-making support/facilitation  Intervention Outcomes  Outcomes Connection to spiritual care;Awareness of support;Reduced isolation  Spiritual Care Plan  Spiritual Care Issues Still Outstanding No further spiritual care needs at this time (see row info)

## 2023-02-20 NOTE — TOC Initial Note (Signed)
Transition of Care [facility]) - Initial/Assessment Note    Patient Details  Name: Jason Burke MRN: [id number] Date of Birth: 26-Dec-1955  Transition of Care [facility]) CM/SW Contact:    Darolyn Rua, LCSW Phone Number: 02/20/2023, 3:09 PM  Clinical Narrative:                  Patient is from home alone, patient has dementia. CSW spoke with patient's son Talyn Duffner, reports patient is unsafe at home alone as he tried to drive or wander, reports interested in memory care placement. CSW to send referrals.   Arion requests that he complete POA paperwork, CSW has spoke with chaplain who will meet patient at 4:00 pm to complete.   Otho agreeable for email to be sent to financial counselor to see if patient has Medicaid, if not, they are agreeable to start process.   Expected Discharge Plan: Memory Care Barriers to Discharge: Continued Medical Work up   Patient Goals and CMS Choice   CMS Medicare.gov Compare Post Acute Care list provided to:: Patient Represenative (must comment) (son)        Expected Discharge Plan and Services       Living arrangements for the past 2 months: Single Family Home                                      Prior Living Arrangements/Services Living arrangements for the past 2 months: Single Family Home Lives with:: Self                   Activities of Daily Living      Permission Sought/Granted                  Emotional Assessment              Admission diagnosis:  IVC Patient Active Problem List   Diagnosis Date Noted   Encounter for psychiatric assessment 02/20/2023   Coronary artery disease of native artery of native heart with stable angina pectoris (HCC) 07/24/2022   Unstable angina (HCC) 07/24/2022   CAD (coronary artery disease) 07/24/2022   Accelerating angina (HCC) 01/24/2022   Bicuspid aortic valve 01/24/2022   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 [place] [address] Phone: [phone] Fax: [phone]  [facility] - [place], [place] - [address] [place] [address] Phone: [phone] Fax: [phone]     Social Determinants of Health (SDOH) Social History: SDOH Screenings   Tobacco Use: High Risk (02/19/2023)   SDOH Interventions:     Readmission Risk Interventions     No data to display

## 2023-02-20 NOTE — NC FL2 (Signed)
[place] MEDICAID [id number] LEVEL OF CARE FORM     IDENTIFICATION  Patient Name: Jason Burke Birthdate: Nov 10, 1955 Sex: male Admission Date (Current Location): 02/20/2023  [facility] and [place] Number:  Chiropodist and Address:  [facility], [address] [place], [place] [address]      Provider Number: [id number]  Attending Physician Name and Address:  No att. providers found  Relative Name and Phone Number:  Aksel Francisco [phone]    Current Level of Care: Hospital Recommended Level of Care: Memory Care Prior Approval Number:    Date Approved/Denied:   PASRR Number: [id number]  Discharge Plan: Other (Comment) (Memory Care)    Current Diagnoses: Patient Active Problem List   Diagnosis Date Noted   Encounter for psychiatric assessment 02/20/2023   Coronary artery disease of native artery of native heart with stable angina pectoris (HCC) 07/24/2022   Unstable angina (HCC) 07/24/2022   CAD (coronary artery disease) 07/24/2022   Accelerating angina (HCC) 01/24/2022   Bicuspid aortic valve 01/24/2022   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   Mixed hyperlipidemia 11/02/2013   Tobacco abuse 11/02/2013   Essential hypertension 11/02/2013   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    Orientation RESPIRATION BLADDER Height & Weight     Self  O2, Normal Incontinent, External catheter Weight: 125 lb (56.7 kg) Height:  4\' 10"  (147.3 cm)  BEHAVIORAL SYMPTOMS/MOOD NEUROLOGICAL BOWEL NUTRITION STATUS      Continent Diet (see discharge summary)  AMBULATORY STATUS COMMUNICATION OF NEEDS Skin   Limited Assist Verbally Normal                       Personal Care Assistance Level of Assistance  Bathing, Feeding, Dressing, Total care Bathing Assistance: Limited assistance Feeding assistance: Independent Dressing Assistance: Limited assistance Total Care Assistance: Limited assistance    Functional Limitations Info  Sight, Hearing, Speech Sight Info: Adequate Hearing Info: Adequate Speech Info: Adequate    SPECIAL CARE FACTORS FREQUENCY                       Contractures Contractures Info: Not present    Additional Factors Info  Code Status, Allergies Code Status Info: full Allergies Info: Methocarbamol           Current Medications (02/20/2023):  This is the current hospital active medication list No current facility-administered medications for this encounter.   Current Outpatient Medications  Medication Sig Dispense Refill   aspirin EC 81 MG tablet Take 1 tablet (81 mg total) by mouth daily. Swallow whole.     ezetimibe (ZETIA) 10 MG tablet Take 1 tablet (10 mg total) by mouth daily. 90 tablet 0   FLUoxetine (PROZAC) 10 MG capsule Take 10 mg by mouth daily.     isosorbide mononitrate (IMDUR) 60 MG 24 hr tablet Take 60 mg by mouth daily.     QUEtiapine (SEROQUEL) 25 MG tablet Take 25 mg by mouth at bedtime.     atorvastatin (LIPITOR) 80 MG tablet Take 80 mg by mouth daily. (Patient not taking: Reported on 02/20/2023)     cetirizine (ZYRTEC) 10 MG tablet Take 10 mg by mouth daily. (Patient not taking: Reported on 02/20/2023)     fluticasone-salmeterol (ADVAIR) 250-50 MCG/ACT AEPB Inhale 1 puff into the lungs 2 (two) times daily as needed. (Patient not taking: Reported on 02/20/2023)     isosorbide mononitrate (IMDUR) 30 MG 24 hr tablet  Take 1 tablet (30 mg total) by mouth daily. (Patient not taking: Reported on 02/20/2023) 30 tablet 1   losartan (COZAAR) 25 MG tablet Take 25 mg by mouth daily. (Patient not taking: Reported on 02/20/2023)     metFORMIN (GLUMETZA) 500 MG (MOD) 24 hr tablet Take 500 mg by mouth daily with breakfast. (Patient not taking: Reported on 02/20/2023)     nitroGLYCERIN (NITROSTAT) 0.4 MG SL tablet Place 0.4 mg under the tongue every 5 (five) minutes as needed for chest pain. (Patient not taking: Reported on 02/20/2023)     VENTOLIN HFA  108 (90 Base) MCG/ACT inhaler Inhale      2 puffs every 4 hours as needed for wheeze, cough, or SOB. (PLEASE SUBSTITUTE INSURANCE PREFERRED INHALER) (Patient not taking: Reported on 02/20/2023)     Vitamin D, Ergocalciferol, (DRISDOL) 1.25 MG (50000 UNIT) CAPS capsule Take 50,000 Units by mouth every 7 (seven) days. Monday (Patient not taking: Reported on 02/20/2023)       Discharge Medications: Please see discharge summary for a list of discharge medications.  Relevant Imaging Results:  Relevant Lab Results:   Additional Information SSN: [id number]  Darolyn Rua, LCSW

## 2023-02-20 NOTE — ED Provider Notes (Signed)
Emergency Medicine Observation Re-evaluation Note  Jason Burke is a 67 y.o. male, seen on rounds today.  Pt initially presented to the ED for complaints of IVC  Mental Eval  Currently, the patient is resting comfortably.  Physical Exam  BP (!) 176/75   Pulse 67   Temp 98.2 F (36.8 C)   Resp 18   Ht 4\' 10"  (1.473 m)   Wt 56.7 kg   SpO2 97%   BMI 26.13 kg/m  General: No acute distress Cardiac: Well-perfused extremities Lungs: No respiratory distress Psych: Appropriate mood and affect  ED Course / MDM  EKG:   I have reviewed the labs performed to date as well as medications administered while in observation.  Recent changes in the last 24 hours include none.  Plan  Current plan is for placement.   Merwyn Katos, MD 02/22/23 [phone]

## 2023-02-20 NOTE — ED Notes (Signed)
Patient has not received breakfast tray at this time.

## 2023-02-20 NOTE — ED Notes (Signed)
IVC/Consult Ordered/ Pending 

## 2023-02-20 NOTE — Consult Note (Signed)
[facility] Face-to-Face Psychiatry Consult   Reason for Consult:  Memory issues, wandering, and suicidal ideations Referring Physician:  EDP Patient Identification: Jason Burke MRN:  [id number] Principal Diagnosis: Encounter for psychiatric assessment Diagnosis:  Principal Problem:   Encounter for psychiatric assessment   Total Time spent with patient: 45 minutes  Subjective:   Jason Burke is a 67 y.o. male patient admitted with "bizarre behavior, confusion, SI, not taking meds, and getting lost driving randomly."  HPI:  67 yo male presented to the ED by [facility] for concerns by his family.  On assessment, he is calm and cooperative with no suicidal/homicidal ideations, hallucinations, paranoia, or substance abuse.  His son was contacted who was concerned about his memory and did get him to a provider earlier this year.  Now, that the client's wife passed, he is wandering and paranoid at times that the family is taking his money.  He will not allow the family to help him with his bills yet "doesn't want to use his money to pay bills."  The son wants to become his POA and "get the ball rolling" for proper placement. The family cannot care for him. TOC consult placed for assistance in this matter.  The client is not meeting criteria for a psychiatric admission.  Past Psychiatric History: none  Risk to Self:  memory issues Risk to Others:  none Prior Inpatient Therapy:  none Prior Outpatient Therapy:  none  Past Medical History:  Past Medical History:  Diagnosis Date   Bicuspid aortic valve    a. 11/2009 Echo: EF 55%, nl RV fxn, bicuspid AoV; b. 07/2022 Echo: EF 55-60%, no rwma, nl RV fxn, AoV sclerosis w/o stenosis. Indeterminant # of cusps.   Coronary artery disease    a. s/p prior LAD stenting; b. 2008 CABGx1: LIMA->LAD; c. 11/2009 PCI ([facility]): LM 20ost, LAD 40p, 24m, LCX 30p, RI 40 diffuse, RCA 44m (2.75x23 Xience DES); d. 07/2022 Cath: LM nl, LAD 10p ISR, 17m, LCX small/nl, RCA 10ost/p, 15p,  patent mid stent, 65d, LIMA->LAD atretic, 100 @ insertion-->med rx.   Diabetes mellitus without complication (HCC)    Hyperlipidemia    Hypertension    MI (myocardial infarction) (HCC) 09/08/2006    Past Surgical History:  Procedure Laterality Date   CARDIAC CATHETERIZATION     CORONARY ANGIOPLASTY  2007   stent x 2   CORONARY ARTERY BYPASS GRAFT  2008   x 3   LEFT HEART CATH AND CORS/GRAFTS ANGIOGRAPHY Left 07/24/2022   Procedure: LEFT HEART CATH AND CORS/GRAFTS ANGIOGRAPHY;  Surgeon: Marykay Lex, MD;  Location: [facility] INVASIVE CV LAB;  Service: Cardiovascular;  Laterality: Left;   RENAL ARTERY STENT  2007   Left kidney   Family History:  Family History  Problem Relation Age of Onset   Heart disease Mother 69       < age 37   Heart disease Father 96   Alcoholism Father    Family Psychiatric  History: see abive Social History:  Social History   Substance and Sexual Activity  Alcohol Use No     Social History   Substance and Sexual Activity  Drug Use No    Social History   Socioeconomic History   Marital status: Married    Spouse name: Not on file   Number of children: Not on file   Years of education: Not on file   Highest education level: Not on file  Occupational History   Not on file  Tobacco Use  Smoking status: Every Day    Packs/day: 1.00    Years: 55.00    Additional pack years: 0.00    Total pack years: 55.00    Types: Cigarettes   Smokeless tobacco: Never   Tobacco comments:    01/23/2022 2-3 cigarettes per day currently  Vaping Use   Vaping Use: Never used  Substance and Sexual Activity   Alcohol use: No   Drug use: No   Sexual activity: Not on file  Other Topics Concern   Not on file  Social History Narrative   Not on file   Social Determinants of Health   Financial Resource Strain: Not on file  Food Insecurity: Not on file  Transportation Needs: Not on file  Physical Activity: Not on file  Stress: Not on file  Social  Connections: Not on file   Additional Social History:    Allergies:   Allergies  Allergen Reactions   Methocarbamol Palpitations    Labs:  Results for orders placed or performed during the hospital encounter of 02/20/23 (from the past 48 hour(s))  Comprehensive metabolic panel     Status: Abnormal   Collection Time: 02/19/23 10:58 PM  Result Value Ref Range   Sodium 136 135 - 145 mmol/L   Potassium 3.5 3.5 - 5.1 mmol/L   Chloride 103 98 - 111 mmol/L   CO2 25 22 - 32 mmol/L   Glucose, Bld 214 (H) 70 - 99 mg/dL    Comment: Glucose reference range applies only to samples taken after fasting for at least 8 hours.   BUN 17 8 - 23 mg/dL   Creatinine, Ser 1.61 0.61 - 1.24 mg/dL   Calcium 8.8 (L) 8.9 - 10.3 mg/dL   Total Protein 6.7 6.5 - 8.1 g/dL   Albumin 3.9 3.5 - 5.0 g/dL   AST 21 15 - 41 U/L   ALT 17 0 - 44 U/L   Alkaline Phosphatase 81 38 - 126 U/L   Total Bilirubin 0.5 0.3 - 1.2 mg/dL   GFR, Estimated >09 >60 mL/min    Comment: (NOTE) Calculated using the CKD-EPI Creatinine Equation (2021)    Anion gap 8 5 - 15    Comment: Performed at [facility], [address], [place], [place] [address]  Ethanol     Status: None   Collection Time: 02/19/23 10:58 PM  Result Value Ref Range   Alcohol, Ethyl (B) <10 <10 mg/dL    Comment: (NOTE) Lowest detectable limit for serum alcohol is 10 mg/dL.  For medical purposes only. Performed at [facility], [address] [place], [place] [address]   CBC with Diff     Status: None   Collection Time: 02/19/23 10:58 PM  Result Value Ref Range   WBC 6.2 4.0 - 10.5 K/uL   RBC 4.98 4.22 - 5.81 MIL/uL   Hemoglobin 15.2 13.0 - 17.0 g/dL   HCT 47.8 29.5 - 62.1 %   MCV 91.4 80.0 - 100.0 fL   MCH 30.5 26.0 - 34.0 pg   MCHC 33.4 30.0 - 36.0 g/dL   RDW 30.8 65.7 - 84.6 %   Platelets 242 150 - 400 K/uL   nRBC 0.0 0.0 - 0.2 %   Neutrophils Relative % 57 %   Neutro Abs 3.6 1.7 - 7.7 K/uL   Lymphocytes Relative 29 %    Lymphs Abs 1.8 0.7 - 4.0 K/uL   Monocytes Relative 8 %   Monocytes Absolute 0.5 0.1 - 1.0 K/uL  Eosinophils Relative 5 %   Eosinophils Absolute 0.3 0.0 - 0.5 K/uL   Basophils Relative 1 %   Basophils Absolute 0.1 0.0 - 0.1 K/uL   Immature Granulocytes 0 %   Abs Immature Granulocytes 0.02 0.00 - 0.07 K/uL    Comment: Performed at [facility], [address], [place], [place] [address]  Acetaminophen level     Status: Abnormal   Collection Time: 02/19/23 10:58 PM  Result Value Ref Range   Acetaminophen (Tylenol), Serum <10 (L) 10 - 30 ug/mL    Comment: (NOTE) Therapeutic concentrations vary significantly. A range of 10-30 ug/mL  may be an effective concentration for many patients. However, some  are best treated at concentrations outside of this range. Acetaminophen concentrations >150 ug/mL at 4 hours after ingestion  and >50 ug/mL at 12 hours after ingestion are often associated with  toxic reactions.  Performed at [facility], [address] [place], [place] [address]   Salicylate level     Status: Abnormal   Collection Time: 02/19/23 10:58 PM  Result Value Ref Range   Salicylate Lvl <7.0 (L) 7.0 - 30.0 mg/dL    Comment: Performed at [facility], [address] [place], [place] [address]  Troponin I (High Sensitivity)     Status: None   Collection Time: 02/19/23 10:58 PM  Result Value Ref Range   Troponin I (High Sensitivity) 7 <18 ng/L    Comment: (NOTE) Elevated high sensitivity troponin I (hsTnI) values and significant  changes across serial measurements may suggest ACS but many other  chronic and acute conditions are known to elevate hsTnI results.  Refer to the "Links" section for chest pain algorithms and additional  guidance. Performed at [facility], [address] [place], [place] [address]   Urine Drug Screen, Qualitative     Status: None   Collection Time: 02/19/23 11:45 PM  Result Value Ref Range   Tricyclic,  Ur Screen NONE DETECTED NONE DETECTED   Amphetamines, Ur Screen NONE DETECTED NONE DETECTED   MDMA (Ecstasy)Ur Screen NONE DETECTED NONE DETECTED   Cocaine Metabolite,Ur [place] NONE DETECTED NONE DETECTED   Opiate, Ur Screen NONE DETECTED NONE DETECTED   Phencyclidine (PCP) Ur S NONE DETECTED NONE DETECTED   Cannabinoid 50 Ng, Ur [place] NONE DETECTED NONE DETECTED   Barbiturates, Ur Screen NONE DETECTED NONE DETECTED   Benzodiazepine, Ur Scrn NONE DETECTED NONE DETECTED   Methadone Scn, Ur NONE DETECTED NONE DETECTED    Comment: (NOTE) Tricyclics + metabolites, urine    Cutoff 1000 ng/mL Amphetamines + metabolites, urine  Cutoff 1000 ng/mL MDMA (Ecstasy), urine              Cutoff 500 ng/mL Cocaine Metabolite, urine          Cutoff 300 ng/mL Opiate + metabolites, urine        Cutoff 300 ng/mL Phencyclidine (PCP), urine         Cutoff 25 ng/mL Cannabinoid, urine                 Cutoff 50 ng/mL Barbiturates + metabolites, urine  Cutoff 200 ng/mL Benzodiazepine, urine              Cutoff 200 ng/mL Methadone, urine                   Cutoff 300 ng/mL  The urine drug screen provides only a preliminary, unconfirmed analytical test result and should not be used for non-medical  purposes. Clinical consideration and professional judgment should be applied to any positive drug screen result due to possible interfering substances. A more specific alternate chemical method must be used in order to obtain a confirmed analytical result. Gas chromatography / mass spectrometry (GC/MS) is the preferred confirm atory method. Performed at [facility], [address] [place], [place] [address]     No current facility-administered medications for this encounter.   Current Outpatient Medications  Medication Sig Dispense Refill   isosorbide mononitrate (IMDUR) 60 MG 24 hr tablet Take 60 mg by mouth daily.     QUEtiapine (SEROQUEL) 25 MG tablet Take 25 mg by mouth at bedtime.     aspirin EC 81 MG  tablet Take 1 tablet (81 mg total) by mouth daily. Swallow whole.     atorvastatin (LIPITOR) 80 MG tablet Take 80 mg by mouth daily.     cetirizine (ZYRTEC) 10 MG tablet Take 10 mg by mouth daily.     ezetimibe (ZETIA) 10 MG tablet Take 1 tablet (10 mg total) by mouth daily. 90 tablet 0   FLUoxetine (PROZAC) 10 MG capsule Take 10 mg by mouth daily.     fluticasone-salmeterol (ADVAIR) 250-50 MCG/ACT AEPB Inhale 1 puff into the lungs 2 (two) times daily as needed.     isosorbide mononitrate (IMDUR) 30 MG 24 hr tablet Take 1 tablet (30 mg total) by mouth daily. 30 tablet 1   losartan (COZAAR) 25 MG tablet Take 25 mg by mouth daily.     metFORMIN (GLUMETZA) 500 MG (MOD) 24 hr tablet Take 500 mg by mouth daily with breakfast.     nitroGLYCERIN (NITROSTAT) 0.4 MG SL tablet Place 0.4 mg under the tongue every 5 (five) minutes as needed for chest pain.     VENTOLIN HFA 108 (90 Base) MCG/ACT inhaler Inhale      2 puffs every 4 hours as needed for wheeze, cough, or SOB. (PLEASE SUBSTITUTE INSURANCE PREFERRED INHALER)     Vitamin D, Ergocalciferol, (DRISDOL) 1.25 MG (50000 UNIT) CAPS capsule Take 50,000 Units by mouth every 7 (seven) days. Monday      Musculoskeletal: Strength & Muscle Tone: within normal limits Gait & Station: normal Patient leans: N/A   Psychiatric Specialty Exam: Physical Exam Vitals and nursing note reviewed.  Constitutional:      Appearance: Normal appearance.  HENT:     Head: Normocephalic.     Nose: Nose normal.  Pulmonary:     Effort: Pulmonary effort is normal.  Musculoskeletal:        General: Normal range of motion.     Cervical back: Normal range of motion.  Neurological:     General: No focal deficit present.     Mental Status: He is alert.  Psychiatric:        Attention and Perception: Attention and perception normal.        Mood and Affect: Mood and affect normal.        Speech: Speech normal.        Behavior: Behavior normal. Behavior is cooperative.         Thought Content: Thought content normal.        Cognition and Memory: Memory is impaired.        Judgment: Judgment normal.    Review of Systems  Psychiatric/Behavioral:  Positive for memory loss.   All other systems reviewed and are negative.   Blood pressure (!) 145/68, pulse 65, temperature 98.4 F (36.9 C), resp. rate 18, height  4\' 10"  (1.473 m), weight 56.7 kg, SpO2 94 %.Body mass index is 26.13 kg/m.  General Appearance: Casual  Eye Contact:  Good  Speech:   difficult to understand at times, appears to be missing teeth  Volume:  Normal  Mood:  Euthymic  Affect:  Congruent  Thought Process:  Coherent  Orientation:  Full (Time, Place, and Person)  Thought Content:  Logical  Suicidal Thoughts:  No  Homicidal Thoughts:  No  Memory:  Immediate;   Fair Recent;   Fair Remote;   Fair  Judgement:  Fair  Insight:  Lacking  Psychomotor Activity:  Normal  Concentration:  Concentration: Fair and Attention Span: Fair  Recall:  [employer] of Knowledge:  Fair  Language:  Fair  Akathisia:  No  Handed:  Right  AIMS (if indicated):     Assets:  Housing Leisure Time Resilience Social Support  ADL's:  Intact  Cognition:  Impaired,  Mild  Sleep:        Physical Exam: Physical Exam Vitals and nursing note reviewed.  Constitutional:      Appearance: Normal appearance.  HENT:     Head: Normocephalic.     Nose: Nose normal.  Pulmonary:     Effort: Pulmonary effort is normal.  Musculoskeletal:        General: Normal range of motion.     Cervical back: Normal range of motion.  Neurological:     General: No focal deficit present.     Mental Status: He is alert.  Psychiatric:        Attention and Perception: Attention and perception normal.        Mood and Affect: Mood and affect normal.        Speech: Speech normal.        Behavior: Behavior normal. Behavior is cooperative.        Thought Content: Thought content normal.        Cognition and Memory: Memory is impaired.         Judgment: Judgment normal.   Review of Systems  Psychiatric/Behavioral:  Positive for memory loss.   All other systems reviewed and are negative.  Blood pressure (!) 176/75, pulse 67, temperature 98.2 F (36.8 C), resp. rate 18, height 4\' 10"  (1.473 m), weight 56.7 kg, SpO2 97 %. Body mass index is 26.13 kg/m.  Treatment Plan Summary: Memory Issues: Recommend an evaluation by a neurologist [facility] consult for POA assistance and possible placement information  Disposition: No evidence of imminent risk to self or others at present.   Patient does not meet criteria for psychiatric inpatient admission. Supportive therapy provided about ongoing stressors.  Nanine Means, NP 02/20/2023 10:04 AM

## 2023-02-20 NOTE — ED Notes (Signed)
Pt voluntarily dressed out in hospital provided wine colored scrubs with non slip footwear.

## 2023-02-21 NOTE — ED Notes (Signed)
Patient wandering around unit and attempting to open all doors.  Explained to patient that the doors were locked and he was unable to leave tonight.  Patient also asking to see his wife.  Reoriented patient to the fact that he was at the hospital in the ED, that his wife had passed away 2 weeks ago.  Patient becomes very loud and saying that it isn't true.

## 2023-02-21 NOTE — ED Notes (Signed)
Pt received night time snack tray with a cup of water in a foam cup along with a cup of chocolate ice cream. Pt took food to room to eat. Pt informed no other food will be given until breakfast time in the morning.

## 2023-02-21 NOTE — ED Notes (Signed)
Pt given dinner tray.

## 2023-02-21 NOTE — ED Notes (Signed)
Pt received from rm 23 to bhu 6. Oriented to unit and advised will be updated on POC. Pt asking to leave but was informed he has to be here for now. Pt agreeable to plan and currently in room laying on bed watching tv.

## 2023-02-21 NOTE — ED Notes (Signed)
Pt denies SI/HI/AVH on assessment 

## 2023-02-21 NOTE — ED Provider Notes (Signed)
Emergency Medicine Observation Re-evaluation Note  Donivon Eulberg Lagreca is a 67 y.o. male, seen on rounds today.  Pt initially presented to the ED for complaints of IVC  Mental Eval  Currently, the patient is resting comfortably.  Physical Exam  BP (!) 144/76 (BP Location: Right Arm)   Pulse (!) 58   Temp 98 F (36.7 C) (Oral)   Resp 18   Ht 4\' 10"  (1.473 m)   Wt 56.7 kg   SpO2 93%   BMI 26.13 kg/m  General: No acute distress Cardiac: Well-perfused extremities Lungs: No respiratory distress Psych: Appropriate mood and affect  ED Course / MDM  EKG:EKG Interpretation  Date/Time:  Thursday February 19 2023 23:36:01 EDT Ventricular Rate:  63 PR Interval:  142 QRS Duration: 82 QT Interval:  408 QTC Calculation: 417 R Axis:   79 Text Interpretation: Normal sinus rhythm Normal ECG When compared with ECG of 01-Feb-2023 14:12, No significant change was found Confirmed by UNCONFIRMED, DOCTOR ([id number]), editor Fredric Mare, Tammy [phone]) on 02/20/2023 2:38:34 PM  I have reviewed the labs performed to date as well as medications administered while in observation.  Recent changes in the last 24 hours include none.  Plan  Current plan is for placement.   Merwyn Katos, MD 02/21/23 [phone]

## 2023-02-21 NOTE — ED Provider Notes (Signed)
Psychiatry to speak with patient's disposition, based on TOC needs.  At the present time I feel the patient would not benefit from psychiatric inpatient stabilization, however I have confirmed with Molli Knock of psych team that patient is still under ivc   Sharyn Creamer, MD 02/21/23 [phone]

## 2023-02-21 NOTE — ED Provider Notes (Signed)
Emergency Medicine Observation Re-evaluation Note  Physical Exam   BP (!) 94/58 (BP Location: Left Arm) Comment: pt has been asleep all morning, has just woken up.  Pulse 70   Temp 98.3 F (36.8 C)   Resp 20   Ht 4\' 10"  (1.473 m)   Wt 56.7 kg   SpO2 90%   BMI 26.13 kg/m   Patient appears in no acute distress.  ED Course / MDM   No reported events during my shift at the time of this note.   Pt is awaiting dispo from SW   Pilar Jarvis MD    Pilar Jarvis, MD 02/21/23 [phone]

## 2023-02-21 NOTE — Consult Note (Signed)
Client is for [facility] for SNF placement, psych cleared.   Continues to be calm and cooperative with no issues noted.  Nanine Means, PMHNP

## 2023-02-21 NOTE — ED Notes (Signed)
Pt wandering around dayroom "looking for my black truck" and also looking for his wife. Pt unaware of where he is and what he is doing here. Pt informed why he is here and redirected to room multiple times by EDT and security. Pt repeatedly asking "Can I got out that door" referring to the locked door to the nurses station. Pt informed he is not permitted to leave for concerns of his safety.  RN informed pt his wife passed away 2 weeks ago and pt disputes this claim stating "I just saw her two days ago." Pt refuses to believe his wife has passed away. Pt redirected to his room by RN.

## 2023-02-21 NOTE — ED Notes (Signed)
Patient again reported that his glasses were broken, with patient's permission this RN called and spoke with patient's son who states he will go by patient's house tomorrow and see if there might be a spare pair that he can bring patient.

## 2023-02-22 MED ORDER — ACETAMINOPHEN 500 MG PO TABS
1000.0000 mg | ORAL_TABLET | Freq: Four times a day (QID) | ORAL | Status: DC | PRN
  Administered 2023-02-22 – 2023-02-23 (×2): 1000 mg via ORAL
  Filled 2023-02-22 (×2): qty 2

## 2023-02-22 MED ORDER — QUETIAPINE FUMARATE 25 MG PO TABS: 50.0000 mg | ORAL_TABLET | Freq: Every day | ORAL | Status: DC

## 2023-02-22 MED ORDER — QUETIAPINE FUMARATE 25 MG PO TABS
50.0000 mg | ORAL_TABLET | Freq: Every day | ORAL | Status: DC
  Administered 2023-02-22 – 2023-02-24 (×3): 50 mg via ORAL
  Filled 2023-02-22 (×3): qty 2

## 2023-02-22 NOTE — ED Notes (Signed)
Pt visitor here to see pt, both sitting in dayroom.

## 2023-02-22 NOTE — ED Notes (Signed)
Patient asking for help to find his car. Writer explained to patient he is currently at the hospital and will be here for the night and to try and lay down and get some sleep. Patient shown to room. Patient currently laying down in bed.

## 2023-02-22 NOTE — ED Notes (Signed)
This RN called pt's son for update on getting Dementia diagnosis.

## 2023-02-22 NOTE — ED Notes (Signed)
Pt given lunch tray.

## 2023-02-22 NOTE — ED Notes (Signed)
Pt given dinner tray.

## 2023-02-22 NOTE — Consult Note (Signed)
The patient had wandering behaviors last night and was looking for his wife, redirection needed. Seroquel increased to assist with sleep and wandering behaviors.  Pleasant on assessment and stated he was "pretty good".  Sleep and appetite are "good".  He denies any issues, TOC is working on placement for him.   Nanine Means, PMHNP

## 2023-02-22 NOTE — ED Notes (Signed)
Pt son asking to speak to this RN, wanting an update on SNF placement. Son also asking about getting Dementia diagnosis for pt. I told pt's son I would have to look into it and would call to update. Son is in agreement with this plan.

## 2023-02-22 NOTE — ED Notes (Signed)

## 2023-02-22 NOTE — ED Provider Notes (Signed)
Emergency Medicine Observation Re-evaluation Note  Jason Burke is a 67 y.o. male, seen on rounds today.  Pt initially presented to the ED for complaints of IVC  Mental Eval No acute issues overnight per nursing staff  Physical Exam  BP 120/71 (BP Location: Left Arm)   Pulse 68   Temp 98.6 F (37 C) (Oral)   Resp 18   Ht 4\' 10"  (1.473 m)   Wt 56.7 kg   SpO2 92%   BMI 26.13 kg/m  Physical Exam  Patient appears well, no acute distress, normal WOB    ED Course / MDM  EKG:EKG Interpretation  Date/Time:  Thursday February 19 2023 23:36:01 EDT Ventricular Rate:  63 PR Interval:  142 QRS Duration: 82 QT Interval:  408 QTC Calculation: 417 R Axis:   79 Text Interpretation: Normal sinus rhythm Normal ECG When compared with ECG of 01-Feb-2023 14:12, No significant change was found Confirmed by UNCONFIRMED, DOCTOR ([id number]), editor Fredric Mare, Tammy [phone]) on 02/20/2023 2:38:34 PM  I have reviewed the labs performed to date as well as medications administered while in observation.  Recent changes in the last 24 hours include none.  Plan  Current plan is for dispo per social work.    Chesley Noon, MD 02/22/23 [phone]

## 2023-02-22 NOTE — ED Notes (Signed)
VS obtained. Snack received by pt. Pt brought snack to room to eat.  

## 2023-02-22 NOTE — ED Notes (Signed)
Patient in room talking to self.

## 2023-02-22 NOTE — ED Notes (Signed)
Pt given breakfast tray and drink at this time. No other needs voiced.

## 2023-02-22 NOTE — ED Notes (Signed)
Pt given snack and juice at this time. 

## 2023-02-23 NOTE — ED Provider Notes (Signed)
Emergency Medicine Observation Re-evaluation Note  Jason Burke is a 67 y.o. male, seen on rounds today.  Pt initially presented to the ED for complaints of IVC  Mental Eval  No acute issues overnight per nursing staff  Physical Exam  BP 124/72 (BP Location: Right Arm)   Pulse 83   Temp 98.2 F (36.8 C) (Oral)   Resp 15   Ht 4\' 10"  (1.473 m)   Wt 56.7 kg   SpO2 94%   BMI 26.13 kg/m  Physical Exam  Resting comfortably, appears to be sleeping comfortably at this time   ED Course / MDM   I have reviewed the labs performed to date as well as medications administered while in observation.  Recent changes in the last 24 hours include none.  Plan  Current plan is for dispo per social work.   Sharyn Creamer, MD 02/23/23 [phone]

## 2023-02-23 NOTE — ED Notes (Signed)
Pt given snack and water

## 2023-02-23 NOTE — TOC Progression Note (Signed)
Transition of Care [facility]) - Progression Note    Patient Details  Name: Jason Burke MRN: [id number] Date of Birth: 22-Aug-1956  [facility] CM/SW Contact  Darolyn Rua, [place] Phone Number: 02/23/2023, 1:59 PM  Clinical Narrative:     CSW  met with Tammy with [facility] for patient's bedside assessment. This was completed, she reports she will send assessment to facilities and see who can accept patient.   Expected Discharge Plan: Memory Care Barriers to Discharge: Continued Medical Work up  Expected Discharge Plan and Services       Living arrangements for the past 2 months: Single Family Home                                       Social Determinants of Health (SDOH) Interventions SDOH Screenings   Tobacco Use: High Risk (02/19/2023)    Readmission Risk Interventions     No data to display

## 2023-02-23 NOTE — ED Notes (Signed)
Patient is pleasant, took all morning medications without difficulty, He is pleasant, smiling and laughing , staff will continue to monitor for safety.

## 2023-02-23 NOTE — ED Notes (Signed)
Pt given dinner tray and drink at this time.

## 2023-02-23 NOTE — ED Notes (Signed)
Snack and beverage given. 

## 2023-02-23 NOTE — Progress Notes (Signed)
Mirna Mires made an attempt to get a Notary to complete Adv. Dir forms for the Pt. Notary at ED was hesitant to come notarize for a Pt who is here on Behavior Health related issue. Chap update the social worker on the same and also mentioned the same to own supervisor. This Mirna Mires will continue try raise a Notary.   02/23/23 1400  Spiritual Encounters  Type of Visit Follow up  Care provided to: Pt not available  Referral source Social worker/Care management/TOC  Reason for visit Advance directives  OnCall Visit No

## 2023-02-23 NOTE — TOC Progression Note (Signed)
Transition of Care [facility]) - Progression Note    Patient Details  Name: Jason Burke MRN: [id number] Date of Birth: Apr 07, 1956  [facility]) CM/SW Contact  Darolyn Rua, [place] Phone Number: 02/23/2023, 9:02 AM  Clinical Narrative:     CSW notes no bed offers at this time, however several [facility] facilities are "considering" in hub. CSW has reached out to Tammy with [facility] to inquire if any facilities able to accept, as patient has Rosann Auerbach, has started medicaid application and has dementia.   Pending her response at this time.    Expected Discharge Plan: Memory Care Barriers to Discharge: Continued Medical Work up  Expected Discharge Plan and Services       Living arrangements for the past 2 months: Single Family Home                                       Social Determinants of Health (SDOH) Interventions SDOH Screenings   Tobacco Use: High Risk (02/19/2023)    Readmission Risk Interventions     No data to display

## 2023-02-23 NOTE — ED Notes (Signed)
Patient is up and down in bed, talking to himself.  Patient is very confused on where he is at. This Clinical research associate redirects patient into bed and reorients patient to place.

## 2023-02-23 NOTE — ED Notes (Signed)
This tech obtained vitals on pt.  

## 2023-02-23 NOTE — Consult Note (Signed)
Jason Burke, a 67 year-old Caucasian male presented to the emergency department by law enforcement for familial concerns of bizarre behavior, confusion, suicidal ideation, noncompliance with medication regimen, randomly getting lost while driving. Of note the patient did not meet criteria for psychiatric inpatient admission and was cleared psychiatrically on 02/20/23. Patient is being followed by the [facility] team who is seeking memory care placement.   During rounding today, patient observed lying in bed asleep. He aroused easily to verbal stimuli. Upon awakening, he is alert and oriented to self only. Pleasantly confused, cooperative, answered simple questions appropriately. He does not appear to be responding to internal or external stimuli during the encounter. Sleep and appetite reported as satisfactory. Nursing staff reports patient is cooperative, compliant with medication regimen. He reportedly demonstrated increased confusion last night/early this morning on his current location, talking to himself, and asking for help to find his car. Patient reportedly redirected and reoriented  successfully. No negative or aggressive behaviors noted or reported at  this time, nor has he required the use of as needed IM agitation medication.     Jnaya Butrick H. Willeen Cass, NP 02/23/2023

## 2023-02-23 NOTE — ED Notes (Signed)
IVC/Memory Care placement

## 2023-02-24 NOTE — ED Notes (Signed)
IVC/Pending Placement 

## 2023-02-24 NOTE — ED Notes (Signed)
Pt continues with disorientation and confusion, looking for his wife in her car and going into other pt rooms

## 2023-02-24 NOTE — ED Notes (Signed)
Pt becoming increasingly agitated about not being able to leave. Pt reports his mother is very sick in the hospital in [place] and he needs to go help her. Pt redirected at this time, but does not seem to understand situation and why he is here at this time.

## 2023-02-24 NOTE — ED Notes (Signed)
Pt remains stimulated and awake from other pt behaviors on unit.

## 2023-02-24 NOTE — ED Notes (Signed)
Hospital meal provided, pt tolerated w/o complaints.  Waste discarded appropriately.  

## 2023-02-24 NOTE — ED Notes (Signed)
Patient took meds and tolerated well.

## 2023-02-24 NOTE — ED Notes (Signed)
Social work at bedside with patient. 

## 2023-02-24 NOTE — ED Notes (Signed)
IVC with order needing to be renewed tomorrow (6/19) if going to keep under IVC. As original exams states for (5) days as of 02/19/23. Patient is awaiting potential placement to [facility] in [place].

## 2023-02-24 NOTE — ED Notes (Signed)
Son at bedside to fill out healthcare POA. Social work notified and will be coming down shortly. Family updated

## 2023-02-24 NOTE — ED Notes (Signed)
Pt moved to BHU 7, pt became very confused and disoriented to surrounding on arrival. Believing he was in [place] and waiting for his friend to bring the lawn mower. Pt attempting to leave unit but unable to exit locked door to meet his wife. After much orientation pt agrees to lay in bed and rest while nurse waits for his mower to get here. Covered with blanket and in bed at this time

## 2023-02-24 NOTE — ED Notes (Signed)
Pt has continued to go into other rooms and be lost trying to get to his wifes car. Reorientation and de escalation are becoming unsuccessful currently but he does eventually follow requests.

## 2023-02-24 NOTE — ED Provider Notes (Signed)
Emergency Medicine Observation Re-evaluation Note  Jason Burke is a 67 y.o. male, seen on rounds today.  Pt initially presented to the ED for complaints of IVC  Mental Eval  Currently, the patient is resting in bed. No reported issues from nursing team.   Physical Exam  BP (!) 129/90   Pulse 65   Temp 97.9 F (36.6 C) (Oral)   Resp 17   Ht 4\' 10"  (1.473 m)   Wt 56.7 kg   SpO2 94%   BMI 26.13 kg/m  Physical Exam General: Resting comfortably in bed  ED Course / MDM  EKG:EKG Interpretation  Date/Time:  Thursday February 19 2023 23:36:01 EDT Ventricular Rate:  63 PR Interval:  142 QRS Duration: 82 QT Interval:  408 QTC Calculation: 417 R Axis:   79 Text Interpretation: Normal sinus rhythm Normal ECG When compared with ECG of 01-Feb-2023 14:12, No significant change was found Confirmed by UNCONFIRMED, DOCTOR ([id number]), editor Glendale Chard [phone]) on 02/20/2023 2:38:34 PM   Plan  Current plan is for dispo per social work.    Trinna Post, MD 02/24/23 [phone]

## 2023-02-24 NOTE — ED Notes (Signed)
Pt is becoming agitated with staff for not being allowed to leave as he thinks his wife is in the car. States he is going to call the law on staff and presents with aggressive posture. After he walked into another pts room and attempted again he goes to day room and sits on chair stating he won't leave it all night in defiance to staff. Pt in chair now, visibly upset with staff despite orientation and de escalation measures.

## 2023-02-24 NOTE — TOC Progression Note (Signed)
Transition of Care [facility] - Progression Note    Patient Details  Name: Jason Burke MRN: [id number] Date of Birth: [birth year]  Transition of Care [facility]) CM/SW Contact  Darolyn Rua, [place] Phone Number: 02/24/2023, 12:41 PM  Clinical Narrative:     [facility] in [place] potentially able to accept per Tammy with [facility], will need LOG. CSW has sent request to [facility] director and supervisor pending response at this time.     Expected Discharge Plan: Memory Care Barriers to Discharge: Continued Medical Work up  Expected Discharge Plan and Services       Living arrangements for the past 2 months: Single Family Home                                       Social Determinants of Health (SDOH) Interventions SDOH Screenings   Tobacco Use: High Risk (02/19/2023)    Readmission Risk Interventions     No data to display

## 2023-02-24 NOTE — ED Notes (Signed)
Due to patient having issues in the BHU patient brought back to 24H at this time.

## 2023-02-24 NOTE — ED Provider Notes (Signed)
-----------------------------------------   5:34 AM on 02/24/2023 -----------------------------------------   Blood pressure (!) 145/73, pulse 64, temperature 98.7 F (37.1 C), temperature source Oral, resp. rate 18, height 4\' 10"  (1.473 m), weight 56.7 kg, SpO2 95 %.  The patient is calm and cooperative at this time.  There have been no acute events since the last update.  Awaiting disposition plan from Social Work team.   Irean Hong, MD 02/24/23 [phone]

## 2023-02-25 DIAGNOSIS — R531 Weakness: Secondary | ICD-10-CM | POA: Diagnosis not present

## 2023-02-25 DIAGNOSIS — Z743 Need for continuous supervision: Secondary | ICD-10-CM | POA: Diagnosis not present

## 2023-02-25 MED ORDER — LORAZEPAM 1 MG PO TABS
1.0000 mg | ORAL_TABLET | Freq: Once | ORAL | Status: AC
  Administered 2023-02-25: 1 mg via ORAL
  Filled 2023-02-25: qty 1

## 2023-02-25 NOTE — ED Notes (Signed)
Son updated on departure from facility via ems.Belongings bag 1/1 sent with patient at this time.

## 2023-02-25 NOTE — ED Notes (Signed)
Per social work, son will be picking up patient and taking him to SNF later today

## 2023-02-25 NOTE — ED Notes (Signed)
[facility]  called for  transport to  [facility] in  [place]

## 2023-02-25 NOTE — ED Notes (Signed)
Patient continuously mumbling random things since moving to hallway bed. Patient given pillow and blanket and told to lay back and just relax. Patient did so willingly with no issues. Has no complaints at this time.

## 2023-02-25 NOTE — ED Provider Notes (Addendum)
Emergency Medicine Observation Re-evaluation Note  Jason Burke is a 67 y.o. male, seen on rounds today.  Pt initially presented to the ED for complaints of IVC  Mental Eval  Currently, the patient is is no acute distress. Denies any concerns at this time.  Physical Exam  Blood pressure 98/66, pulse 66, temperature 97.8 F (36.6 C), temperature source Oral, resp. rate 18, height 4\' 10"  (1.473 m), weight 56.7 kg, SpO2 98 %.  Physical Exam: General: No apparent distress Pulm: Normal WOB Neuro: Moving all extremities Psych: Resting comfortably     ED Course / MDM     I have reviewed the labs performed to date as well as medications administered while in observation.  Recent changes in the last 24 hours include: No acute events overnight.  Plan   Current plan: Patient was accepted to SNF memory unit with plans for transfer today. Patient is not under full IVC at this time.    Corena Herter, MD 02/25/23 [phone]    Corena Herter, MD 02/25/23 [id number]

## 2023-02-25 NOTE — TOC Progression Note (Signed)
Transition of Care [facility]) - Progression Note    Patient Details  Name: Jason Burke MRN: [id number] Date of Birth: 20-Jul-1956  Transition of Care [facility]) CM/SW Contact  Margarito Liner, LCSW Phone Number: 02/25/2023, 9:32 AM  Clinical Narrative:  [facility] in [place] confirmed they can accept patient today. HCPOA paperwork has been scanned into the chart.   Expected Discharge Plan: Memory Care Barriers to Discharge: Continued Medical Work up  Expected Discharge Plan and Services       Living arrangements for the past 2 months: Single Family Home                                       Social Determinants of Health (SDOH) Interventions SDOH Screenings   Tobacco Use: High Risk (02/19/2023)    Readmission Risk Interventions     No data to display

## 2023-02-25 NOTE — ED Notes (Signed)
This RN attempted to call report- left voicemail for return phone call at this time.

## 2023-02-25 NOTE — TOC Progression Note (Signed)
Transition of Care [facility]) - Progression Note    Patient Details  Name: ARBY HATT MRN: [id number] Date of Birth: August 26, 1956  [facility]) CM/SW Contact  Darleene Cleaver, [place] Phone Number: 02/24/2023, 3:30pm  Clinical Narrative:     CSW met with patient and his son Avant Shoaff., [phone] to discuss HCPOA paperwork.  Patient is alert and oriented x4 and requested to have his son be officially designated as the [employer].  CSW explained to patient and his son what the HCPOA means, patient expressed understanding.  HCPOA paperwork signed and notarized.  One copy was made for the chart, and the original and an extra copy were given to patient's son.  Patient to potentially discharge tomorrow to [facility] in [place].  TOC continuing to follow patient's progress throughout discharge planning.   Expected Discharge Plan: Memory Care Barriers to Discharge: Barriers Resolved  Expected Discharge Plan and Services       Living arrangements for the past 2 months: Single Family Home                                       Social Determinants of Health (SDOH) Interventions SDOH Screenings   Tobacco Use: High Risk (02/19/2023)    Readmission Risk Interventions     No data to display

## 2023-02-25 NOTE — ED Notes (Signed)
This RN attempted to call report x2 with no answer at this time.

## 2023-02-25 NOTE — ED Notes (Signed)
Pt fidgeting and restless in bed, pt talking to self. Pt calm but restless.

## 2023-02-25 NOTE — TOC Transition Note (Signed)
Transition of Care [facility]) - CM/SW Discharge Note   Patient Details  Name: Jason Burke MRN: [id number] Date of Birth: [birth year]  Transition of Care [facility]) CM/SW Contact:  Margarito Liner, LCSW Phone Number: 02/25/2023, 10:58 AM   Clinical Narrative:  Patient can discharge to [facility] today. Faxed AVS to central intake. RN waiting on call back to provide report. Son will contact CSW with pickup time since per RN, patient is ambulatory. No further concerns. CSW signing off.   Final next level of care: Skilled Nursing Facility Barriers to Discharge: Barriers Resolved   Patient Goals and CMS Choice CMS Medicare.gov Compare Post Acute Care list provided to:: Patient Represenative (must comment) (son)    Discharge Placement PASRR number recieved: 02/20/23 PASRR number recieved: 02/20/23            Patient chooses bed at: Other - please specify in the comment section below: [facility] in [place]) Patient to be transferred to facility by: Son Name of family member notified: Aarush Sahagian Patient and family notified of of transfer: 02/25/23  Discharge Plan and Services Additional resources added to the After Visit Summary for                                       Social Determinants of Health (SDOH) Interventions SDOH Screenings   Tobacco Use: High Risk (02/19/2023)     Readmission Risk Interventions     No data to display

## 2023-02-26 DIAGNOSIS — E785 Hyperlipidemia, unspecified: Secondary | ICD-10-CM | POA: Diagnosis not present

## 2023-02-26 DIAGNOSIS — F4321 Adjustment disorder with depressed mood: Secondary | ICD-10-CM | POA: Diagnosis not present

## 2023-02-26 DIAGNOSIS — I10 Essential (primary) hypertension: Secondary | ICD-10-CM | POA: Diagnosis not present

## 2023-02-26 DIAGNOSIS — J40 Bronchitis, not specified as acute or chronic: Secondary | ICD-10-CM | POA: Diagnosis not present

## 2023-02-26 DIAGNOSIS — F4320 Adjustment disorder, unspecified: Secondary | ICD-10-CM | POA: Diagnosis not present

## 2023-02-26 DIAGNOSIS — E119 Type 2 diabetes mellitus without complications: Secondary | ICD-10-CM | POA: Diagnosis not present

## 2023-04-01 DIAGNOSIS — J40 Bronchitis, not specified as acute or chronic: Secondary | ICD-10-CM | POA: Diagnosis not present

## 2023-04-01 DIAGNOSIS — I10 Essential (primary) hypertension: Secondary | ICD-10-CM | POA: Diagnosis not present

## 2023-04-01 DIAGNOSIS — E785 Hyperlipidemia, unspecified: Secondary | ICD-10-CM | POA: Diagnosis not present

## 2023-04-01 DIAGNOSIS — E119 Type 2 diabetes mellitus without complications: Secondary | ICD-10-CM | POA: Diagnosis not present

## 2023-04-26 ENCOUNTER — Encounter: Payer: Self-pay | Admitting: Pharmacist

## 2023-08-20 DIAGNOSIS — F0280 Dementia in other diseases classified elsewhere without behavioral disturbance: Secondary | ICD-10-CM | POA: Diagnosis not present

## 2023-08-20 DIAGNOSIS — F331 Major depressive disorder, recurrent, moderate: Secondary | ICD-10-CM | POA: Diagnosis not present

## 2023-08-24 DIAGNOSIS — L608 Other nail disorders: Secondary | ICD-10-CM | POA: Diagnosis not present

## 2023-08-24 DIAGNOSIS — L84 Corns and callosities: Secondary | ICD-10-CM | POA: Diagnosis not present

## 2023-08-24 DIAGNOSIS — M79674 Pain in right toe(s): Secondary | ICD-10-CM | POA: Diagnosis not present

## 2023-08-24 DIAGNOSIS — M79675 Pain in left toe(s): Secondary | ICD-10-CM | POA: Diagnosis not present

## 2023-08-24 DIAGNOSIS — B351 Tinea unguium: Secondary | ICD-10-CM | POA: Diagnosis not present

## 2023-08-24 DIAGNOSIS — R2681 Unsteadiness on feet: Secondary | ICD-10-CM | POA: Diagnosis not present

## 2023-08-24 DIAGNOSIS — E1140 Type 2 diabetes mellitus with diabetic neuropathy, unspecified: Secondary | ICD-10-CM | POA: Diagnosis not present

## 2023-08-25 DIAGNOSIS — I700 Atherosclerosis of aorta: Secondary | ICD-10-CM | POA: Diagnosis not present

## 2023-08-25 DIAGNOSIS — J439 Emphysema, unspecified: Secondary | ICD-10-CM | POA: Diagnosis not present

## 2023-08-25 DIAGNOSIS — E1151 Type 2 diabetes mellitus with diabetic peripheral angiopathy without gangrene: Secondary | ICD-10-CM | POA: Diagnosis not present

## 2023-08-25 DIAGNOSIS — G3184 Mild cognitive impairment, so stated: Secondary | ICD-10-CM | POA: Diagnosis not present

## 2023-08-25 DIAGNOSIS — I10 Essential (primary) hypertension: Secondary | ICD-10-CM | POA: Diagnosis not present

## 2023-08-25 DIAGNOSIS — J449 Chronic obstructive pulmonary disease, unspecified: Secondary | ICD-10-CM | POA: Diagnosis not present

## 2023-08-25 DIAGNOSIS — E559 Vitamin D deficiency, unspecified: Secondary | ICD-10-CM | POA: Diagnosis not present

## 2023-08-25 DIAGNOSIS — R011 Cardiac murmur, unspecified: Secondary | ICD-10-CM | POA: Diagnosis not present

## 2023-08-25 DIAGNOSIS — F03B2 Unspecified dementia, moderate, with psychotic disturbance: Secondary | ICD-10-CM | POA: Diagnosis not present

## 2023-08-25 DIAGNOSIS — I25119 Atherosclerotic heart disease of native coronary artery with unspecified angina pectoris: Secondary | ICD-10-CM | POA: Diagnosis not present

## 2023-08-25 DIAGNOSIS — F411 Generalized anxiety disorder: Secondary | ICD-10-CM | POA: Diagnosis not present

## 2023-08-25 DIAGNOSIS — R0989 Other specified symptoms and signs involving the circulatory and respiratory systems: Secondary | ICD-10-CM | POA: Diagnosis not present

## 2023-09-08 DIAGNOSIS — R0989 Other specified symptoms and signs involving the circulatory and respiratory systems: Secondary | ICD-10-CM | POA: Diagnosis not present

## 2023-09-08 DIAGNOSIS — I700 Atherosclerosis of aorta: Secondary | ICD-10-CM | POA: Diagnosis not present

## 2023-09-08 DIAGNOSIS — E785 Hyperlipidemia, unspecified: Secondary | ICD-10-CM | POA: Diagnosis not present

## 2023-09-08 DIAGNOSIS — E1159 Type 2 diabetes mellitus with other circulatory complications: Secondary | ICD-10-CM | POA: Diagnosis not present

## 2023-09-08 DIAGNOSIS — I119 Hypertensive heart disease without heart failure: Secondary | ICD-10-CM | POA: Diagnosis not present

## 2023-09-08 DIAGNOSIS — R011 Cardiac murmur, unspecified: Secondary | ICD-10-CM | POA: Diagnosis not present

## 2023-09-08 DIAGNOSIS — F331 Major depressive disorder, recurrent, moderate: Secondary | ICD-10-CM | POA: Diagnosis not present

## 2023-09-08 DIAGNOSIS — J449 Chronic obstructive pulmonary disease, unspecified: Secondary | ICD-10-CM | POA: Diagnosis not present

## 2023-09-08 DIAGNOSIS — F03B18 Unspecified dementia, moderate, with other behavioral disturbance: Secondary | ICD-10-CM | POA: Diagnosis not present

## 2023-09-08 DIAGNOSIS — E559 Vitamin D deficiency, unspecified: Secondary | ICD-10-CM | POA: Diagnosis not present

## 2023-09-17 ENCOUNTER — Encounter: Payer: Self-pay | Admitting: Acute Care

## 2023-10-30 DIAGNOSIS — B351 Tinea unguium: Secondary | ICD-10-CM | POA: Diagnosis not present

## 2023-10-30 DIAGNOSIS — L608 Other nail disorders: Secondary | ICD-10-CM | POA: Diagnosis not present

## 2023-10-30 DIAGNOSIS — I509 Heart failure, unspecified: Secondary | ICD-10-CM | POA: Diagnosis not present

## 2023-10-30 DIAGNOSIS — L851 Acquired keratosis [keratoderma] palmaris et plantaris: Secondary | ICD-10-CM | POA: Diagnosis not present

## 2023-10-30 DIAGNOSIS — M79675 Pain in left toe(s): Secondary | ICD-10-CM | POA: Diagnosis not present

## 2023-10-30 DIAGNOSIS — M79674 Pain in right toe(s): Secondary | ICD-10-CM | POA: Diagnosis not present

## 2023-10-30 DIAGNOSIS — R2681 Unsteadiness on feet: Secondary | ICD-10-CM | POA: Diagnosis not present

## 2023-12-08 DIAGNOSIS — I119 Hypertensive heart disease without heart failure: Secondary | ICD-10-CM | POA: Diagnosis not present

## 2023-12-08 DIAGNOSIS — Z13820 Encounter for screening for osteoporosis: Secondary | ICD-10-CM | POA: Diagnosis not present

## 2023-12-08 DIAGNOSIS — F03B18 Unspecified dementia, moderate, with other behavioral disturbance: Secondary | ICD-10-CM | POA: Diagnosis not present

## 2023-12-08 DIAGNOSIS — K219 Gastro-esophageal reflux disease without esophagitis: Secondary | ICD-10-CM | POA: Diagnosis not present

## 2023-12-08 DIAGNOSIS — J449 Chronic obstructive pulmonary disease, unspecified: Secondary | ICD-10-CM | POA: Diagnosis not present

## 2023-12-08 DIAGNOSIS — I25119 Atherosclerotic heart disease of native coronary artery with unspecified angina pectoris: Secondary | ICD-10-CM | POA: Diagnosis not present

## 2023-12-23 DIAGNOSIS — F331 Major depressive disorder, recurrent, moderate: Secondary | ICD-10-CM | POA: Diagnosis not present

## 2023-12-23 DIAGNOSIS — F411 Generalized anxiety disorder: Secondary | ICD-10-CM | POA: Diagnosis not present

## 2023-12-23 DIAGNOSIS — F0390 Unspecified dementia without behavioral disturbance: Secondary | ICD-10-CM | POA: Diagnosis not present

## 2023-12-23 DIAGNOSIS — G4700 Insomnia, unspecified: Secondary | ICD-10-CM | POA: Diagnosis not present

## 2023-12-31 DIAGNOSIS — L605 Yellow nail syndrome: Secondary | ICD-10-CM | POA: Diagnosis not present

## 2023-12-31 DIAGNOSIS — R2681 Unsteadiness on feet: Secondary | ICD-10-CM | POA: Diagnosis not present

## 2023-12-31 DIAGNOSIS — L84 Corns and callosities: Secondary | ICD-10-CM | POA: Diagnosis not present

## 2023-12-31 DIAGNOSIS — M79675 Pain in left toe(s): Secondary | ICD-10-CM | POA: Diagnosis not present

## 2023-12-31 DIAGNOSIS — M79674 Pain in right toe(s): Secondary | ICD-10-CM | POA: Diagnosis not present

## 2023-12-31 DIAGNOSIS — E1140 Type 2 diabetes mellitus with diabetic neuropathy, unspecified: Secondary | ICD-10-CM | POA: Diagnosis not present

## 2023-12-31 DIAGNOSIS — B351 Tinea unguium: Secondary | ICD-10-CM | POA: Diagnosis not present

## 2024-01-05 DIAGNOSIS — I25119 Atherosclerotic heart disease of native coronary artery with unspecified angina pectoris: Secondary | ICD-10-CM | POA: Diagnosis not present

## 2024-01-05 DIAGNOSIS — I119 Hypertensive heart disease without heart failure: Secondary | ICD-10-CM | POA: Diagnosis not present

## 2024-01-05 DIAGNOSIS — J449 Chronic obstructive pulmonary disease, unspecified: Secondary | ICD-10-CM | POA: Diagnosis not present

## 2024-01-06 DIAGNOSIS — F0390 Unspecified dementia without behavioral disturbance: Secondary | ICD-10-CM | POA: Diagnosis not present

## 2024-01-06 DIAGNOSIS — E7800 Pure hypercholesterolemia, unspecified: Secondary | ICD-10-CM | POA: Diagnosis not present

## 2024-01-06 DIAGNOSIS — I10 Essential (primary) hypertension: Secondary | ICD-10-CM | POA: Diagnosis not present

## 2024-01-06 DIAGNOSIS — F5105 Insomnia due to other mental disorder: Secondary | ICD-10-CM | POA: Diagnosis not present

## 2024-01-06 DIAGNOSIS — E119 Type 2 diabetes mellitus without complications: Secondary | ICD-10-CM | POA: Diagnosis not present

## 2024-01-06 DIAGNOSIS — F411 Generalized anxiety disorder: Secondary | ICD-10-CM | POA: Diagnosis not present

## 2024-01-06 DIAGNOSIS — J449 Chronic obstructive pulmonary disease, unspecified: Secondary | ICD-10-CM | POA: Diagnosis not present

## 2024-01-06 DIAGNOSIS — Z515 Encounter for palliative care: Secondary | ICD-10-CM | POA: Diagnosis not present

## 2024-01-06 DIAGNOSIS — F331 Major depressive disorder, recurrent, moderate: Secondary | ICD-10-CM | POA: Diagnosis not present

## 2024-01-13 DIAGNOSIS — F331 Major depressive disorder, recurrent, moderate: Secondary | ICD-10-CM | POA: Diagnosis not present

## 2024-01-13 DIAGNOSIS — F0390 Unspecified dementia without behavioral disturbance: Secondary | ICD-10-CM | POA: Diagnosis not present

## 2024-01-13 DIAGNOSIS — F5105 Insomnia due to other mental disorder: Secondary | ICD-10-CM | POA: Diagnosis not present

## 2024-01-13 DIAGNOSIS — F5101 Primary insomnia: Secondary | ICD-10-CM | POA: Diagnosis not present

## 2024-01-13 DIAGNOSIS — G301 Alzheimer's disease with late onset: Secondary | ICD-10-CM | POA: Diagnosis not present

## 2024-01-13 DIAGNOSIS — F411 Generalized anxiety disorder: Secondary | ICD-10-CM | POA: Diagnosis not present

## 2024-02-05 DIAGNOSIS — E119 Type 2 diabetes mellitus without complications: Secondary | ICD-10-CM | POA: Diagnosis not present

## 2024-02-05 DIAGNOSIS — F03B0 Unspecified dementia, moderate, without behavioral disturbance, psychotic disturbance, mood disturbance, and anxiety: Secondary | ICD-10-CM | POA: Diagnosis not present

## 2024-02-05 DIAGNOSIS — I25119 Atherosclerotic heart disease of native coronary artery with unspecified angina pectoris: Secondary | ICD-10-CM | POA: Diagnosis not present

## 2024-02-05 DIAGNOSIS — I119 Hypertensive heart disease without heart failure: Secondary | ICD-10-CM | POA: Diagnosis not present

## 2024-02-05 DIAGNOSIS — J449 Chronic obstructive pulmonary disease, unspecified: Secondary | ICD-10-CM | POA: Diagnosis not present

## 2024-02-09 DIAGNOSIS — Z515 Encounter for palliative care: Secondary | ICD-10-CM | POA: Diagnosis not present

## 2024-02-09 DIAGNOSIS — J439 Emphysema, unspecified: Secondary | ICD-10-CM | POA: Diagnosis not present

## 2024-02-09 DIAGNOSIS — F02818 Dementia in other diseases classified elsewhere, unspecified severity, with other behavioral disturbance: Secondary | ICD-10-CM | POA: Diagnosis not present

## 2024-02-09 DIAGNOSIS — F064 Anxiety disorder due to known physiological condition: Secondary | ICD-10-CM | POA: Diagnosis not present

## 2024-02-09 DIAGNOSIS — G301 Alzheimer's disease with late onset: Secondary | ICD-10-CM | POA: Diagnosis not present

## 2024-02-17 DIAGNOSIS — G301 Alzheimer's disease with late onset: Secondary | ICD-10-CM | POA: Diagnosis not present

## 2024-02-17 DIAGNOSIS — F02B3 Dementia in other diseases classified elsewhere, moderate, with mood disturbance: Secondary | ICD-10-CM | POA: Diagnosis not present

## 2024-02-26 DIAGNOSIS — F331 Major depressive disorder, recurrent, moderate: Secondary | ICD-10-CM | POA: Diagnosis not present

## 2024-02-26 DIAGNOSIS — I119 Hypertensive heart disease without heart failure: Secondary | ICD-10-CM | POA: Diagnosis not present

## 2024-02-26 DIAGNOSIS — F02B3 Dementia in other diseases classified elsewhere, moderate, with mood disturbance: Secondary | ICD-10-CM | POA: Diagnosis not present

## 2024-02-26 DIAGNOSIS — J449 Chronic obstructive pulmonary disease, unspecified: Secondary | ICD-10-CM | POA: Diagnosis not present

## 2024-02-26 DIAGNOSIS — E119 Type 2 diabetes mellitus without complications: Secondary | ICD-10-CM | POA: Diagnosis not present

## 2024-02-26 DIAGNOSIS — F5105 Insomnia due to other mental disorder: Secondary | ICD-10-CM | POA: Diagnosis not present

## 2024-02-26 DIAGNOSIS — Z1331 Encounter for screening for depression: Secondary | ICD-10-CM | POA: Diagnosis not present

## 2024-02-26 DIAGNOSIS — G301 Alzheimer's disease with late onset: Secondary | ICD-10-CM | POA: Diagnosis not present

## 2024-02-26 DIAGNOSIS — F411 Generalized anxiety disorder: Secondary | ICD-10-CM | POA: Diagnosis not present

## 2024-03-02 DIAGNOSIS — Z79899 Other long term (current) drug therapy: Secondary | ICD-10-CM | POA: Diagnosis not present

## 2024-03-07 DIAGNOSIS — M79674 Pain in right toe(s): Secondary | ICD-10-CM | POA: Diagnosis not present

## 2024-03-07 DIAGNOSIS — E1140 Type 2 diabetes mellitus with diabetic neuropathy, unspecified: Secondary | ICD-10-CM | POA: Diagnosis not present

## 2024-03-07 DIAGNOSIS — M79675 Pain in left toe(s): Secondary | ICD-10-CM | POA: Diagnosis not present

## 2024-03-07 DIAGNOSIS — R2681 Unsteadiness on feet: Secondary | ICD-10-CM | POA: Diagnosis not present

## 2024-03-07 DIAGNOSIS — B351 Tinea unguium: Secondary | ICD-10-CM | POA: Diagnosis not present

## 2024-03-07 DIAGNOSIS — L605 Yellow nail syndrome: Secondary | ICD-10-CM | POA: Diagnosis not present

## 2024-03-17 DIAGNOSIS — F064 Anxiety disorder due to known physiological condition: Secondary | ICD-10-CM | POA: Diagnosis not present

## 2024-03-17 DIAGNOSIS — G301 Alzheimer's disease with late onset: Secondary | ICD-10-CM | POA: Diagnosis not present

## 2024-03-17 DIAGNOSIS — Z515 Encounter for palliative care: Secondary | ICD-10-CM | POA: Diagnosis not present

## 2024-03-17 DIAGNOSIS — J449 Chronic obstructive pulmonary disease, unspecified: Secondary | ICD-10-CM | POA: Diagnosis not present

## 2024-03-17 DIAGNOSIS — F02818 Dementia in other diseases classified elsewhere, unspecified severity, with other behavioral disturbance: Secondary | ICD-10-CM | POA: Diagnosis not present

## 2024-03-23 DIAGNOSIS — F02B3 Dementia in other diseases classified elsewhere, moderate, with mood disturbance: Secondary | ICD-10-CM | POA: Diagnosis not present

## 2024-03-23 DIAGNOSIS — F5101 Primary insomnia: Secondary | ICD-10-CM | POA: Diagnosis not present

## 2024-03-23 DIAGNOSIS — G301 Alzheimer's disease with late onset: Secondary | ICD-10-CM | POA: Diagnosis not present

## 2024-03-25 DIAGNOSIS — J449 Chronic obstructive pulmonary disease, unspecified: Secondary | ICD-10-CM | POA: Diagnosis not present

## 2024-03-25 DIAGNOSIS — F02B3 Dementia in other diseases classified elsewhere, moderate, with mood disturbance: Secondary | ICD-10-CM | POA: Diagnosis not present

## 2024-03-25 DIAGNOSIS — E119 Type 2 diabetes mellitus without complications: Secondary | ICD-10-CM | POA: Diagnosis not present

## 2024-04-05 DIAGNOSIS — G301 Alzheimer's disease with late onset: Secondary | ICD-10-CM | POA: Diagnosis not present

## 2024-04-05 DIAGNOSIS — J449 Chronic obstructive pulmonary disease, unspecified: Secondary | ICD-10-CM | POA: Diagnosis not present

## 2024-04-05 DIAGNOSIS — Z6827 Body mass index (BMI) 27.0-27.9, adult: Secondary | ICD-10-CM | POA: Diagnosis not present

## 2024-04-05 DIAGNOSIS — N182 Chronic kidney disease, stage 2 (mild): Secondary | ICD-10-CM | POA: Diagnosis not present

## 2024-04-05 DIAGNOSIS — F02B3 Dementia in other diseases classified elsewhere, moderate, with mood disturbance: Secondary | ICD-10-CM | POA: Diagnosis not present

## 2024-04-05 DIAGNOSIS — Z79899 Other long term (current) drug therapy: Secondary | ICD-10-CM | POA: Diagnosis not present

## 2024-04-05 DIAGNOSIS — E7800 Pure hypercholesterolemia, unspecified: Secondary | ICD-10-CM | POA: Diagnosis not present

## 2024-04-05 DIAGNOSIS — I1310 Hypertensive heart and chronic kidney disease without heart failure, with stage 1 through stage 4 chronic kidney disease, or unspecified chronic kidney disease: Secondary | ICD-10-CM | POA: Diagnosis not present

## 2024-04-05 DIAGNOSIS — E1122 Type 2 diabetes mellitus with diabetic chronic kidney disease: Secondary | ICD-10-CM | POA: Diagnosis not present

## 2024-04-22 DIAGNOSIS — F02B3 Dementia in other diseases classified elsewhere, moderate, with mood disturbance: Secondary | ICD-10-CM | POA: Diagnosis not present

## 2024-04-22 DIAGNOSIS — G301 Alzheimer's disease with late onset: Secondary | ICD-10-CM | POA: Diagnosis not present

## 2024-05-03 DIAGNOSIS — N182 Chronic kidney disease, stage 2 (mild): Secondary | ICD-10-CM | POA: Diagnosis not present

## 2024-05-03 DIAGNOSIS — E785 Hyperlipidemia, unspecified: Secondary | ICD-10-CM | POA: Diagnosis not present

## 2024-05-03 DIAGNOSIS — F02B3 Dementia in other diseases classified elsewhere, moderate, with mood disturbance: Secondary | ICD-10-CM | POA: Diagnosis not present

## 2024-05-03 DIAGNOSIS — J449 Chronic obstructive pulmonary disease, unspecified: Secondary | ICD-10-CM | POA: Diagnosis not present

## 2024-05-03 DIAGNOSIS — E1122 Type 2 diabetes mellitus with diabetic chronic kidney disease: Secondary | ICD-10-CM | POA: Diagnosis not present

## 2024-05-03 DIAGNOSIS — I25119 Atherosclerotic heart disease of native coronary artery with unspecified angina pectoris: Secondary | ICD-10-CM | POA: Diagnosis not present

## 2024-05-03 DIAGNOSIS — I1310 Hypertensive heart and chronic kidney disease without heart failure, with stage 1 through stage 4 chronic kidney disease, or unspecified chronic kidney disease: Secondary | ICD-10-CM | POA: Diagnosis not present

## 2024-05-03 DIAGNOSIS — G301 Alzheimer's disease with late onset: Secondary | ICD-10-CM | POA: Diagnosis not present

## 2024-05-10 DIAGNOSIS — M79675 Pain in left toe(s): Secondary | ICD-10-CM | POA: Diagnosis not present

## 2024-05-10 DIAGNOSIS — L608 Other nail disorders: Secondary | ICD-10-CM | POA: Diagnosis not present

## 2024-05-10 DIAGNOSIS — B351 Tinea unguium: Secondary | ICD-10-CM | POA: Diagnosis not present

## 2024-05-10 DIAGNOSIS — R2681 Unsteadiness on feet: Secondary | ICD-10-CM | POA: Diagnosis not present

## 2024-05-10 DIAGNOSIS — M79674 Pain in right toe(s): Secondary | ICD-10-CM | POA: Diagnosis not present

## 2024-05-10 DIAGNOSIS — I2510 Atherosclerotic heart disease of native coronary artery without angina pectoris: Secondary | ICD-10-CM | POA: Diagnosis not present

## 2024-05-17 DIAGNOSIS — G301 Alzheimer's disease with late onset: Secondary | ICD-10-CM | POA: Diagnosis not present

## 2024-05-17 DIAGNOSIS — N182 Chronic kidney disease, stage 2 (mild): Secondary | ICD-10-CM | POA: Diagnosis not present

## 2024-05-17 DIAGNOSIS — F0283 Dementia in other diseases classified elsewhere, unspecified severity, with mood disturbance: Secondary | ICD-10-CM | POA: Diagnosis not present

## 2024-05-17 DIAGNOSIS — I25119 Atherosclerotic heart disease of native coronary artery with unspecified angina pectoris: Secondary | ICD-10-CM | POA: Diagnosis not present

## 2024-05-17 DIAGNOSIS — I1310 Hypertensive heart and chronic kidney disease without heart failure, with stage 1 through stage 4 chronic kidney disease, or unspecified chronic kidney disease: Secondary | ICD-10-CM | POA: Diagnosis not present

## 2024-05-17 DIAGNOSIS — F02B3 Dementia in other diseases classified elsewhere, moderate, with mood disturbance: Secondary | ICD-10-CM | POA: Diagnosis not present

## 2024-05-17 DIAGNOSIS — M545 Low back pain, unspecified: Secondary | ICD-10-CM | POA: Diagnosis not present

## 2024-05-18 DIAGNOSIS — F5101 Primary insomnia: Secondary | ICD-10-CM | POA: Diagnosis not present

## 2024-05-18 DIAGNOSIS — F02B3 Dementia in other diseases classified elsewhere, moderate, with mood disturbance: Secondary | ICD-10-CM | POA: Diagnosis not present

## 2024-05-18 DIAGNOSIS — G301 Alzheimer's disease with late onset: Secondary | ICD-10-CM | POA: Diagnosis not present

## 2024-06-01 DIAGNOSIS — I25119 Atherosclerotic heart disease of native coronary artery with unspecified angina pectoris: Secondary | ICD-10-CM | POA: Diagnosis not present

## 2024-06-01 DIAGNOSIS — K219 Gastro-esophageal reflux disease without esophagitis: Secondary | ICD-10-CM | POA: Diagnosis not present

## 2024-06-01 DIAGNOSIS — E1122 Type 2 diabetes mellitus with diabetic chronic kidney disease: Secondary | ICD-10-CM | POA: Diagnosis not present

## 2024-06-01 DIAGNOSIS — J449 Chronic obstructive pulmonary disease, unspecified: Secondary | ICD-10-CM | POA: Diagnosis not present

## 2024-06-01 DIAGNOSIS — G301 Alzheimer's disease with late onset: Secondary | ICD-10-CM | POA: Diagnosis not present

## 2024-06-01 DIAGNOSIS — N182 Chronic kidney disease, stage 2 (mild): Secondary | ICD-10-CM | POA: Diagnosis not present

## 2024-06-01 DIAGNOSIS — F02B3 Dementia in other diseases classified elsewhere, moderate, with mood disturbance: Secondary | ICD-10-CM | POA: Diagnosis not present

## 2024-06-01 DIAGNOSIS — I1310 Hypertensive heart and chronic kidney disease without heart failure, with stage 1 through stage 4 chronic kidney disease, or unspecified chronic kidney disease: Secondary | ICD-10-CM | POA: Diagnosis not present

## 2024-07-04 DIAGNOSIS — I129 Hypertensive chronic kidney disease with stage 1 through stage 4 chronic kidney disease, or unspecified chronic kidney disease: Secondary | ICD-10-CM | POA: Diagnosis not present

## 2024-07-04 DIAGNOSIS — Z6829 Body mass index (BMI) 29.0-29.9, adult: Secondary | ICD-10-CM | POA: Diagnosis not present

## 2024-07-04 DIAGNOSIS — R635 Abnormal weight gain: Secondary | ICD-10-CM | POA: Diagnosis not present

## 2024-07-04 DIAGNOSIS — G301 Alzheimer's disease with late onset: Secondary | ICD-10-CM | POA: Diagnosis not present

## 2024-07-04 DIAGNOSIS — H1045 Other chronic allergic conjunctivitis: Secondary | ICD-10-CM | POA: Diagnosis not present

## 2024-07-04 DIAGNOSIS — N182 Chronic kidney disease, stage 2 (mild): Secondary | ICD-10-CM | POA: Diagnosis not present

## 2024-07-04 DIAGNOSIS — K219 Gastro-esophageal reflux disease without esophagitis: Secondary | ICD-10-CM | POA: Diagnosis not present

## 2024-07-04 DIAGNOSIS — F02B3 Dementia in other diseases classified elsewhere, moderate, with mood disturbance: Secondary | ICD-10-CM | POA: Diagnosis not present

## 2024-07-12 DIAGNOSIS — L603 Nail dystrophy: Secondary | ICD-10-CM | POA: Diagnosis not present

## 2024-07-12 DIAGNOSIS — M79675 Pain in left toe(s): Secondary | ICD-10-CM | POA: Diagnosis not present

## 2024-07-12 DIAGNOSIS — E1140 Type 2 diabetes mellitus with diabetic neuropathy, unspecified: Secondary | ICD-10-CM | POA: Diagnosis not present

## 2024-07-12 DIAGNOSIS — B351 Tinea unguium: Secondary | ICD-10-CM | POA: Diagnosis not present

## 2024-07-12 DIAGNOSIS — M79674 Pain in right toe(s): Secondary | ICD-10-CM | POA: Diagnosis not present

## 2024-07-12 DIAGNOSIS — R2681 Unsteadiness on feet: Secondary | ICD-10-CM | POA: Diagnosis not present

## 2024-07-13 DIAGNOSIS — F5101 Primary insomnia: Secondary | ICD-10-CM | POA: Diagnosis not present

## 2024-07-13 DIAGNOSIS — G301 Alzheimer's disease with late onset: Secondary | ICD-10-CM | POA: Diagnosis not present

## 2024-07-13 DIAGNOSIS — F02B3 Dementia in other diseases classified elsewhere, moderate, with mood disturbance: Secondary | ICD-10-CM | POA: Diagnosis not present

## 2024-07-21 DIAGNOSIS — E785 Hyperlipidemia, unspecified: Secondary | ICD-10-CM | POA: Diagnosis not present

## 2024-07-21 DIAGNOSIS — N182 Chronic kidney disease, stage 2 (mild): Secondary | ICD-10-CM | POA: Diagnosis not present

## 2024-07-21 DIAGNOSIS — F0283 Dementia in other diseases classified elsewhere, unspecified severity, with mood disturbance: Secondary | ICD-10-CM | POA: Diagnosis not present

## 2024-07-21 DIAGNOSIS — G301 Alzheimer's disease with late onset: Secondary | ICD-10-CM | POA: Diagnosis not present

## 2024-07-21 DIAGNOSIS — I129 Hypertensive chronic kidney disease with stage 1 through stage 4 chronic kidney disease, or unspecified chronic kidney disease: Secondary | ICD-10-CM | POA: Diagnosis not present

## 2024-07-21 DIAGNOSIS — E1122 Type 2 diabetes mellitus with diabetic chronic kidney disease: Secondary | ICD-10-CM | POA: Diagnosis not present

## 2024-08-10 DIAGNOSIS — G301 Alzheimer's disease with late onset: Secondary | ICD-10-CM | POA: Diagnosis not present

## 2024-08-10 DIAGNOSIS — F5101 Primary insomnia: Secondary | ICD-10-CM | POA: Diagnosis not present

## 2024-08-10 DIAGNOSIS — F02B3 Dementia in other diseases classified elsewhere, moderate, with mood disturbance: Secondary | ICD-10-CM | POA: Diagnosis not present

## 2024-08-10 DIAGNOSIS — I739 Peripheral vascular disease, unspecified: Secondary | ICD-10-CM | POA: Diagnosis not present

## 2024-08-29 DIAGNOSIS — F331 Major depressive disorder, recurrent, moderate: Secondary | ICD-10-CM | POA: Diagnosis not present

## 2024-08-29 DIAGNOSIS — G4700 Insomnia, unspecified: Secondary | ICD-10-CM | POA: Diagnosis not present

## 2024-08-29 DIAGNOSIS — F419 Anxiety disorder, unspecified: Secondary | ICD-10-CM | POA: Diagnosis not present

## 2024-08-29 DIAGNOSIS — K219 Gastro-esophageal reflux disease without esophagitis: Secondary | ICD-10-CM | POA: Diagnosis not present
